# Patient Record
Sex: Female | Born: 1990 | Hispanic: Yes | Smoking: Never smoker
Health system: Southern US, Community
[De-identification: ages and names within clinical notes are randomized; demographics above are authoritative.]

## PROBLEM LIST (undated history)

## (undated) DIAGNOSIS — M5416 Radiculopathy, lumbar region: Secondary | ICD-10-CM

## (undated) DIAGNOSIS — G5603 Carpal tunnel syndrome, bilateral upper limbs: Secondary | ICD-10-CM

## (undated) DIAGNOSIS — M5126 Other intervertebral disc displacement, lumbar region: Secondary | ICD-10-CM

## (undated) DIAGNOSIS — M51369 Other intervertebral disc degeneration, lumbar region without mention of lumbar back pain or lower extremity pain: Secondary | ICD-10-CM

## (undated) DIAGNOSIS — Z789 Other specified health status: Secondary | ICD-10-CM

## (undated) DIAGNOSIS — M5431 Sciatica, right side: Secondary | ICD-10-CM

## (undated) DIAGNOSIS — M5430 Sciatica, unspecified side: Secondary | ICD-10-CM

## (undated) HISTORY — PX: CHOLECYSTECTOMY: SHX55

## (undated) HISTORY — DX: Other specified health status: Z78.9

## (undated) HISTORY — PX: TONSILLECTOMY: SUR1361

---

## 2013-06-26 ENCOUNTER — Emergency Department: Payer: Self-pay | Admitting: Emergency Medicine

## 2013-08-21 ENCOUNTER — Emergency Department: Payer: Self-pay | Admitting: Emergency Medicine

## 2013-08-21 LAB — COMPREHENSIVE METABOLIC PANEL
ALT: 22 U/L
ANION GAP: 7 (ref 7–16)
AST: 24 U/L (ref 15–37)
Albumin: 4 g/dL (ref 3.4–5.0)
Alkaline Phosphatase: 90 U/L
BUN: 14 mg/dL (ref 7–18)
Bilirubin,Total: 0.3 mg/dL (ref 0.2–1.0)
CHLORIDE: 102 mmol/L (ref 98–107)
CREATININE: 0.82 mg/dL (ref 0.60–1.30)
Calcium, Total: 8.8 mg/dL (ref 8.5–10.1)
Co2: 31 mmol/L (ref 21–32)
EGFR (African American): >60
EGFR (Non-African Amer.): >60
GLUCOSE: 106 mg/dL — AB (ref 65–99)
Osmolality: 280 (ref 275–301)
Potassium: 3.6 mmol/L (ref 3.5–5.1)
Sodium: 140 mmol/L (ref 136–145)
TOTAL PROTEIN: 8 g/dL (ref 6.4–8.2)

## 2013-08-21 LAB — URINALYSIS, COMPLETE
BILIRUBIN, UR: NEGATIVE
Bacteria: NONE SEEN
Blood: NEGATIVE
Glucose,UR: NEGATIVE mg/dL (ref 0–75)
KETONE: NEGATIVE
Leukocyte Esterase: NEGATIVE
Nitrite: NEGATIVE
Ph: 7 (ref 4.5–8.0)
Protein: NEGATIVE
RBC,UR: 1 /HPF (ref 0–5)
SPECIFIC GRAVITY: 1.018 (ref 1.003–1.030)

## 2013-08-21 LAB — CBC
HCT: 41.7 % (ref 35.0–47.0)
HGB: 14.1 g/dL (ref 12.0–16.0)
MCH: 30.6 pg (ref 26.0–34.0)
MCHC: 33.8 g/dL (ref 32.0–36.0)
MCV: 91 fL (ref 80–100)
PLATELETS: 225 10*3/uL (ref 150–440)
RBC: 4.6 10*6/uL (ref 3.80–5.20)
RDW: 12.5 % (ref 11.5–14.5)
WBC: 9.1 10*3/uL (ref 3.6–11.0)

## 2013-08-22 LAB — LIPASE, BLOOD: Lipase: 125 U/L (ref 73–393)

## 2013-11-10 LAB — HM PAP SMEAR

## 2016-06-27 ENCOUNTER — Emergency Department
Admission: EM | Admit: 2016-06-27 | Discharge: 2016-06-27 | Disposition: A | Discharge status: Home / Self Care | Payer: Managed Care, Other (non HMO) | Attending: Student in an Organized Health Care Education/Training Program | Admitting: Student in an Organized Health Care Education/Training Program

## 2016-06-27 ENCOUNTER — Encounter: Payer: Self-pay | Admitting: Emergency Medicine

## 2016-06-27 ENCOUNTER — Emergency Department: Payer: Managed Care, Other (non HMO)

## 2016-06-27 DIAGNOSIS — R109 Unspecified abdominal pain: Secondary | ICD-10-CM

## 2016-06-27 DIAGNOSIS — R197 Diarrhea, unspecified: Secondary | ICD-10-CM | POA: Insufficient documentation

## 2016-06-27 DIAGNOSIS — Z9049 Acquired absence of other specified parts of digestive tract: Secondary | ICD-10-CM | POA: Diagnosis not present

## 2016-06-27 DIAGNOSIS — R112 Nausea with vomiting, unspecified: Secondary | ICD-10-CM | POA: Insufficient documentation

## 2016-06-27 DIAGNOSIS — M791 Myalgia: Secondary | ICD-10-CM | POA: Diagnosis present

## 2016-06-27 LAB — CBC
HEMATOCRIT: 42.3 % (ref 35.0–47.0)
Hemoglobin: 14.9 g/dL (ref 12.0–16.0)
MCH: 31.8 pg (ref 26.0–34.0)
MCHC: 35.2 g/dL (ref 32.0–36.0)
MCV: 90.3 fL (ref 80.0–100.0)
Platelets: 186 10*3/uL (ref 150–440)
RBC: 4.69 MIL/uL (ref 3.80–5.20)
RDW: 12.5 % (ref 11.5–14.5)
WBC: 8.2 10*3/uL (ref 3.6–11.0)

## 2016-06-27 LAB — COMPREHENSIVE METABOLIC PANEL
ALK PHOS: 66 U/L (ref 38–126)
ALT: 52 U/L (ref 14–54)
AST: 93 U/L — ABNORMAL HIGH (ref 15–41)
Albumin: 4 g/dL (ref 3.5–5.0)
Anion gap: 8 (ref 5–15)
BILIRUBIN TOTAL: 0.7 mg/dL (ref 0.3–1.2)
BUN: 11 mg/dL (ref 6–20)
CO2: 26 mmol/L (ref 22–32)
Calcium: 9 mg/dL (ref 8.9–10.3)
Chloride: 102 mmol/L (ref 101–111)
Creatinine, Ser: 0.83 mg/dL (ref 0.44–1.00)
GFR calc Af Amer: >60 mL/min (ref >60)
GFR calc non Af Amer: >60 mL/min (ref >60)
GLUCOSE: 112 mg/dL — AB (ref 65–99)
POTASSIUM: 3.6 mmol/L (ref 3.5–5.1)
Sodium: 136 mmol/L (ref 135–145)
TOTAL PROTEIN: 7.8 g/dL (ref 6.5–8.1)

## 2016-06-27 LAB — URINALYSIS, COMPLETE (UACMP) WITH MICROSCOPIC
BACTERIA UA: NONE SEEN
Bilirubin Urine: NEGATIVE
Glucose, UA: NEGATIVE mg/dL
Ketones, ur: NEGATIVE mg/dL
NITRITE: NEGATIVE
PROTEIN: 30 mg/dL — AB
SPECIFIC GRAVITY, URINE: 1.018 (ref 1.005–1.030)
pH: 6 (ref 5.0–8.0)

## 2016-06-27 LAB — HCG, QUANTITATIVE, PREGNANCY

## 2016-06-27 LAB — PREGNANCY, URINE: PREG TEST UR: NEGATIVE

## 2016-06-27 LAB — LIPASE, BLOOD: LIPASE: 21 U/L (ref 11–51)

## 2016-06-27 MED ORDER — KETOROLAC TROMETHAMINE 30 MG/ML IJ SOLN
15.0000 mg | Freq: Once | INTRAMUSCULAR | Status: AC
  Administered 2016-06-27: 15 mg via INTRAVENOUS
  Filled 2016-06-27: qty 1

## 2016-06-27 MED ORDER — GI COCKTAIL ~~LOC~~
30.0000 mL | Freq: Once | ORAL | Status: AC
  Administered 2016-06-27: 30 mL via ORAL
  Filled 2016-06-27: qty 30

## 2016-06-27 MED ORDER — DICYCLOMINE HCL 10 MG PO CAPS: 10.0000 mg | ORAL_CAPSULE | Freq: Three times a day (TID) | ORAL | 0 refills | Status: DC | PRN

## 2016-06-27 MED ORDER — PROMETHAZINE HCL 25 MG/ML IJ SOLN
12.5000 mg | Freq: Once | INTRAMUSCULAR | Status: AC
  Administered 2016-06-27: 12.5 mg via INTRAVENOUS
  Filled 2016-06-27: qty 1

## 2016-06-27 MED ORDER — PROMETHAZINE HCL 12.5 MG PO TABS: 12.5000 mg | ORAL_TABLET | Freq: Four times a day (QID) | ORAL | 0 refills | Status: DC | PRN

## 2016-06-27 MED ORDER — SODIUM CHLORIDE 0.9 % IV BOLUS (SEPSIS)
1000.0000 mL | Freq: Once | INTRAVENOUS | Status: AC
  Administered 2016-06-27: 1000 mL via INTRAVENOUS

## 2016-06-27 NOTE — ED Provider Notes (Signed)
[facility] Emergency Department Provider Note    None    (approximate)  I have reviewed the triage vital signs and the nursing notes.   HISTORY  Chief Complaint Generalized Body Aches and Abdominal Cramping    HPI Teresa Strickland is a 26 y.o. female who presents with chief complaint of generalized body aches associated with nausea, decreased appetite and non-bloody non-melanotic diarrhea. Patient states that the abdominal pain comes in waves. She currently has none right now but has not been able to eat or drink anything because of the nausea. States that she did have a fever earlier in the day. No sick contacts. No shortness of breath. No right lower quadrant pain. No vaginal discharge. No dysuria or increased urinary frequency. No flank pain. She is status post cholecystectomy. Asked where her primary location of her pain and she points to the epigastric region.   History reviewed. No pertinent past medical history. History reviewed. No pertinent family history. Past Surgical History:  Procedure Laterality Date  . CHOLECYSTECTOMY     There are no active problems to display for this patient.     Prior to Admission medications   Medication Sig Start Date End Date Taking? Authorizing Provider  dicyclomine (BENTYL) 10 MG capsule Take 1 capsule (10 mg total) by mouth 3 (three) times daily as needed for spasms. 06/27/16 07/11/16  Willy Eddyobinson, Ava Deguire, MD  promethazine (PHENERGAN) 12.5 MG tablet Take 1 tablet (12.5 mg total) by mouth every 6 (six) hours as needed for nausea or vomiting. 06/27/16   Willy Eddyobinson, Nayda Riesen, MD    Allergies Patient has no known allergies.    Social History Social History  Substance Use Topics  . Smoking status: Never Smoker  . Smokeless tobacco: Never Used  . Alcohol use 3.0 oz/week    5 Cans of beer per week    Review of Systems Patient denies headaches, rhinorrhea, blurry vision, numbness, shortness of breath, chest pain,  edema, cough, abdominal pain, nausea, vomiting, diarrhea, dysuria, fevers, rashes or hallucinations unless otherwise stated above in HPI. ____________________________________________   PHYSICAL EXAM:  VITAL SIGNS: Vitals:   06/27/16 1900 06/27/16 2000  BP: 114/72 109/73  Pulse: 74 74  Resp:    Temp:      Constitutional: Alert and oriented. Well appearing and in no acute distress. Eyes: Conjunctivae are normal.  Head: Atraumatic. Nose: No congestion/rhinnorhea. Mouth/Throat: Mucous membranes are moist.   Neck: No stridor. Painless ROM.  Cardiovascular: Normal rate, regular rhythm. Grossly normal heart sounds.  Good peripheral circulation. Respiratory: Normal respiratory effort.  No retractions. Lungs CTAB. Gastrointestinal: Soft with mild epigastric ttp. No distention. No RLQ ttp No abdominal bruits. No CVA tenderness. Musculoskeletal: No lower extremity tenderness nor edema.  No joint effusions. Neurologic:  Normal speech and language. No gross focal neurologic deficits are appreciated. No facial droop Skin:  Skin is warm, dry and intact. No rash noted. Psychiatric: Mood and affect are normal. Speech and behavior are normal.  ____________________________________________   LABS (all labs ordered are listed, but only abnormal results are displayed)  Results for orders placed or performed during the hospital encounter of 06/27/16 (from the past 24 hour(s))  Lipase, blood     Status: None   Collection Time: 06/27/16  3:42 PM  Result Value Ref Range   Lipase 21 11 - 51 U/L  Comprehensive metabolic panel     Status: Abnormal   Collection Time: 06/27/16  3:42 PM  Result Value Ref Range   Sodium  136 135 - 145 mmol/L   Potassium 3.6 3.5 - 5.1 mmol/L   Chloride 102 101 - 111 mmol/L   CO2 26 22 - 32 mmol/L   Glucose, Bld 112 (H) 65 - 99 mg/dL   BUN 11 6 - 20 mg/dL   Creatinine, Ser 9.14 0.44 - 1.00 mg/dL   Calcium 9.0 8.9 - 78.2 mg/dL   Total Protein 7.8 6.5 - 8.1 g/dL    Albumin 4.0 3.5 - 5.0 g/dL   AST 93 (H) 15 - 41 U/L   ALT 52 14 - 54 U/L   Alkaline Phosphatase 66 38 - 126 U/L   Total Bilirubin 0.7 0.3 - 1.2 mg/dL   GFR calc non Af Amer >60 >60 mL/min   GFR calc Af Amer >60 >60 mL/min   Anion gap 8 5 - 15  CBC     Status: None   Collection Time: 06/27/16  3:42 PM  Result Value Ref Range   WBC 8.2 3.6 - 11.0 K/uL   RBC 4.69 3.80 - 5.20 MIL/uL   Hemoglobin 14.9 12.0 - 16.0 g/dL   HCT 95.6 21.3 - 08.6 %   MCV 90.3 80.0 - 100.0 fL   MCH 31.8 26.0 - 34.0 pg   MCHC 35.2 32.0 - 36.0 g/dL   RDW 57.8 46.9 - 62.9 %   Platelets 186 150 - 440 K/uL  Urinalysis, Complete w Microscopic     Status: Abnormal   Collection Time: 06/27/16  3:42 PM  Result Value Ref Range   Color, Urine YELLOW (A) YELLOW   APPearance HAZY (A) CLEAR   Specific Gravity, Urine 1.018 1.005 - 1.030   pH 6.0 5.0 - 8.0   Glucose, UA NEGATIVE NEGATIVE mg/dL   Hgb urine dipstick SMALL (A) NEGATIVE   Bilirubin Urine NEGATIVE NEGATIVE   Ketones, ur NEGATIVE NEGATIVE mg/dL   Protein, ur 30 (A) NEGATIVE mg/dL   Nitrite NEGATIVE NEGATIVE   Leukocytes, UA TRACE (A) NEGATIVE   RBC / HPF 0-5 0 - 5 RBC/hpf   WBC, UA 6-30 0 - 5 WBC/hpf   Bacteria, UA NONE SEEN NONE SEEN   Squamous Epithelial / LPF 0-5 (A) NONE SEEN   Mucous PRESENT   hCG, quantitative, pregnancy     Status: None   Collection Time: 06/27/16  3:42 PM  Result Value Ref Range   hCG, Beta Chain, Quant, S <1 <5 mIU/mL  Pregnancy, urine     Status: None   Collection Time: 06/27/16  3:42 PM  Result Value Ref Range   Preg Test, Ur NEGATIVE NEGATIVE   ____________________________________________  ____________________________________________  RADIOLOGY  I personally reviewed all radiographic images ordered to evaluate for the above acute complaints and reviewed radiology reports and findings.  These findings were personally discussed with the patient.  Please see medical record for radiology  report.  ____________________________________________   PROCEDURES  Procedure(s) performed:  Procedures    Critical Care performed: no ____________________________________________   INITIAL IMPRESSION / ASSESSMENT AND PLAN / ED COURSE  Pertinent labs & imaging results that were available during my care of the patient were reviewed by me and considered in my medical decision making (see chart for details).   DDX: colitis, gastritis, enteritis, viral illness  Teresa Strickland is a 26 y.o. who presents to the ED with symptoms as described above. Patient well-appearing and in no acute distress. Abdominal exam is soft and benign. Do not feel CT imaging clinically indicated. No evidence of pelvic infection based on history.  The patient will be placed on continuous pulse oximetry and telemetry for monitoring.  Laboratory evaluation will be sent to evaluate for the above complaints.     Clinical Course as of Jun 28 28  Fri Jun 27, 2016  1846 Patient reassessed and in acute distress.  Blood work is reassuring. Patient tolerating oral hydration. Patient states symptoms improved after medications. Due to patient was stable for discharge with close outpatient follow-up. I instructed the patient to return in 12-24 hours if she develops any sort of abdominal pain.  [PR]    Clinical Course User Index [PR] Willy Eddy, MD     ____________________________________________   FINAL CLINICAL IMPRESSION(S) / ED DIAGNOSES  Final diagnoses:  Abdominal cramping  Nausea vomiting and diarrhea      NEW MEDICATIONS STARTED DURING THIS VISIT:  There are no discharge medications for this patient.    Note:  This document was prepared using Dragon voice recognition software and may include unintentional dictation errors.    Willy Eddy, MD 06/28/16 [phone]

## 2016-06-27 NOTE — ED Notes (Signed)
Patient returned from US.

## 2016-06-27 NOTE — Discharge Instructions (Signed)

## 2016-06-27 NOTE — ED Notes (Signed)
Patient @ XR 

## 2016-06-27 NOTE — ED Notes (Signed)
Patient r/f XR 

## 2016-06-27 NOTE — ED Triage Notes (Signed)
Pt to ED from home c/o generalized body aches, fevers, lower mid abd cramping and diarrhea since Wednesday.

## 2017-08-12 ENCOUNTER — Ambulatory Visit
Admission: RE | Admit: 2017-08-12 | Discharge: 2017-08-12 | Disposition: A | Discharge status: Home / Self Care | Payer: Managed Care, Other (non HMO) | Source: Ambulatory Visit | Attending: Family Medicine | Admitting: Family Medicine

## 2017-08-12 ENCOUNTER — Other Ambulatory Visit: Payer: Self-pay | Admitting: Family Medicine

## 2017-08-12 DIAGNOSIS — M545 Low back pain: Secondary | ICD-10-CM | POA: Diagnosis not present

## 2018-10-29 ENCOUNTER — Other Ambulatory Visit: Payer: Self-pay

## 2018-10-29 DIAGNOSIS — Z20822 Contact with and (suspected) exposure to covid-19: Secondary | ICD-10-CM

## 2018-10-31 LAB — NOVEL CORONAVIRUS, NAA: SARS-CoV-2, NAA: NOT DETECTED

## 2019-01-25 ENCOUNTER — Encounter: Payer: Self-pay | Admitting: Obstetrics and Gynecology

## 2019-01-26 ENCOUNTER — Encounter: Payer: Self-pay | Admitting: Obstetrics and Gynecology

## 2019-01-26 ENCOUNTER — Ambulatory Visit (INDEPENDENT_AMBULATORY_CARE_PROVIDER_SITE_OTHER): Payer: Managed Care, Other (non HMO)

## 2019-01-26 ENCOUNTER — Other Ambulatory Visit: Payer: Managed Care, Other (non HMO)

## 2019-01-26 ENCOUNTER — Other Ambulatory Visit: Payer: Self-pay | Admitting: Obstetrics and Gynecology

## 2019-01-26 ENCOUNTER — Other Ambulatory Visit: Payer: Self-pay

## 2019-01-26 ENCOUNTER — Ambulatory Visit (INDEPENDENT_AMBULATORY_CARE_PROVIDER_SITE_OTHER): Payer: Managed Care, Other (non HMO) | Admitting: Obstetrics and Gynecology

## 2019-01-26 ENCOUNTER — Other Ambulatory Visit: Payer: Self-pay | Admitting: Obstetrics & Gynecology

## 2019-01-26 VITALS — BP 130/90 | Ht 60.0 in | Wt 165.0 lb

## 2019-01-26 DIAGNOSIS — Z30431 Encounter for routine checking of intrauterine contraceptive device: Secondary | ICD-10-CM

## 2019-01-26 DIAGNOSIS — Z124 Encounter for screening for malignant neoplasm of cervix: Secondary | ICD-10-CM

## 2019-01-26 DIAGNOSIS — Z113 Encounter for screening for infections with a predominantly sexual mode of transmission: Secondary | ICD-10-CM | POA: Diagnosis not present

## 2019-01-26 DIAGNOSIS — N8312 Corpus luteum cyst of left ovary: Secondary | ICD-10-CM | POA: Diagnosis not present

## 2019-01-26 DIAGNOSIS — N941 Unspecified dyspareunia: Secondary | ICD-10-CM

## 2019-01-26 NOTE — Patient Instructions (Signed)
I value your feedback and entrusting us with your care. If you get a [facility] patient survey, I would appreciate you taking the time to let us know about your experience today. Thank you!  As of December 16, 2018, your lab results will be released to your MyChart immediately, before I even have a chance to see them. Please give me time to review them and contact you if there are any abnormalities. Thank you for your patience.  

## 2019-01-26 NOTE — Progress Notes (Addendum)
Teresa Haggard, FNP   Chief Complaint  Patient presents with  . Pelvic Pain    during and after intercorse, no bleeding since IUD insertion  . [employer] Employee    HPI:      [name] is a 29 y.o. No obstetric history on file. who LMP was No LMP recorded. (Menstrual status: IUD)., presents today for NP> 3 yrs eval of dyspareunia for the past 2 yrs, but sx frequency and severity have increased past few months. Pt notes sharp, cramping pelvic pain with sex that then becomes epigastric/upper abd bloating and tenderness, and moves to lower abd/pelvic bloating and pain, lasting a few days. Has some gas with bloating. No n/v/d/constipation; has soft stool daily. Tries ibup/heating pad with minimal improvement; sx resolved on their own after a few days. Sx used to be occas but have been every time recently. No sx if not sex active. No vag sx, urin sx, GI sx. No rectal bleeding, blood in stool, GERD sx except with spicy food, no appetite change. S/p cholecystectomy in past.  Had neg UPT 2 wks ago.  Pt with Mirena, replaced 04/18/15. Pt is amenorrheic with IUD, with occas cramping, no BTB. Didn't have dyspareunia with previous IUD. Wasn't sex active first yr of current IUD.  No recent pap, last one 2015.   History reviewed. No pertinent past medical history.   Past Surgical History:  Procedure Laterality Date  . CHOLECYSTECTOMY      History reviewed. No pertinent family history.  Social History   Socioeconomic History  . Marital status: Divorced    Spouse name: Not on file  . Number of children: Not on file  . Years of education: Not on file  . Highest education level: Not on file  Occupational History  . Not on file  Tobacco Use  . Smoking status: Never Smoker  . Smokeless tobacco: Never Used  Substance and Sexual Activity  . Alcohol use: Yes    Alcohol/week: 5.0 standard drinks    Types: 5 Cans of beer per week  . Drug use: No  . Sexual activity: Yes    Birth  control/protection: I.U.D.    Comment: Mirena  Other Topics Concern  . Not on file  Social History Narrative  . Not on file   Social Determinants of Health   Financial Resource Strain:   . Difficulty of Paying Living Expenses: Not on file  Food Insecurity:   . Worried About Charity fundraiser in the Last Year: Not on file  . Ran Out of Food in the Last Year: Not on file  Transportation Needs:   . Lack of Transportation (Medical): Not on file  . Lack of Transportation (Non-Medical): Not on file  Physical Activity:   . Days of Exercise per Week: Not on file  . Minutes of Exercise per Session: Not on file  Stress:   . Feeling of Stress : Not on file  Social Connections:   . Frequency of Communication with Friends and Family: Not on file  . Frequency of Social Gatherings with Friends and Family: Not on file  . Attends Religious Services: Not on file  . Active Member of Clubs or Organizations: Not on file  . Attends Archivist Meetings: Not on file  . Marital Status: Not on file  Intimate Partner Violence:   . Fear of Current or Ex-Partner: Not on file  . Emotionally Abused: Not on file  . Physically Abused: Not on  file  . Sexually Abused: Not on file    Outpatient Medications Prior to Visit  Medication Sig Dispense Refill  . levonorgestrel (MIRENA) 20 MCG/24HR IUD by Intrauterine route.    . dicyclomine (BENTYL) 10 MG capsule Take 1 capsule (10 mg total) by mouth 3 (three) times daily as needed for spasms. 16 capsule 0  . promethazine (PHENERGAN) 12.5 MG tablet Take 1 tablet (12.5 mg total) by mouth every 6 (six) hours as needed for nausea or vomiting. 12 tablet 0   No facility-administered medications prior to visit.      ROS:  Review of Systems  Constitutional: Negative for fatigue, fever and unexpected weight change.  Respiratory: Negative for cough, shortness of breath and wheezing.   Cardiovascular: Negative for chest pain, palpitations and leg  swelling.  Gastrointestinal: Positive for abdominal distention and abdominal pain. Negative for blood in stool, constipation, diarrhea, nausea and vomiting.  Endocrine: Negative for cold intolerance, heat intolerance and polyuria.  Genitourinary: Positive for dyspareunia. Negative for dysuria, flank pain, frequency, genital sores, hematuria, menstrual problem, pelvic pain, urgency, vaginal bleeding, vaginal discharge and vaginal pain.  Musculoskeletal: Negative for back pain, joint swelling and myalgias.  Skin: Negative for rash.  Neurological: Negative for dizziness, syncope, light-headedness, numbness and headaches.  Hematological: Negative for adenopathy.  Psychiatric/Behavioral: Negative for agitation, confusion, sleep disturbance and suicidal ideas. The patient is not nervous/anxious.   BREAST: No symptoms   OBJECTIVE:   Vitals:  BP 130/90   Ht 5' (1.524 m)   Wt 165 lb (74.8 kg)   BMI 32.22 kg/m   Physical Exam Vitals reviewed.  Constitutional:      Appearance: She is well-developed.  Pulmonary:     Effort: Pulmonary effort is normal.  Abdominal:     Palpations: Abdomen is soft.     Tenderness: There is no abdominal tenderness. There is no guarding or rebound.  Genitourinary:    General: Normal vulva.     Pubic Area: No rash.      Labia:        Right: No rash, tenderness or lesion.        Left: No rash, tenderness or lesion.      Vagina: Normal. No vaginal discharge, erythema or tenderness.     Cervix: Normal.     Uterus: Normal. Not enlarged and not tender.      Adnexa: Right adnexa normal and left adnexa normal.       Right: No mass or tenderness.         Left: No mass or tenderness.       Comments: IUD STRINGS IN CX OS Musculoskeletal:        General: Normal range of motion.     Cervical back: Normal range of motion.  Skin:    General: Skin is warm and dry.  Neurological:     General: No focal deficit present.     Mental Status: She is alert and oriented to  person, place, and time.  Psychiatric:        Mood and Affect: Mood normal.        Behavior: Behavior normal.        Thought Content: Thought content normal.        Judgment: Judgment normal.    Results:  ULTRASOUND REPORT  Location: [facility]  Date of Service: 01/26/2019    Indications:Pelvic Pain Findings:  The uterus is anteverted and measures 8.0 x 4.9 x 3.5 cm. Echo texture is homogenous without evidence  of focal masses. The Endometrium measures 4.3 mm. The IUD is correctly placed within the uterus.   Right Ovary measures 3.0 x 2.8 x 2.7 cm. It is normal in appearance. Left Ovary measures 2.6 x 3.6 x 2.6 cm. It is normal in appearance. There is a corpus luteum in the left ovary. Survey of the adnexa demonstrates no adnexal masses. There is complex fluid in the pelvis measuring 65 x 30 x 65 mm.   Impression: 1. Normal appearing uterus, cervix and ovaries.  2. There is a corpus luteum in the left ovary. 3. The IUD is correctly placed within the uterus.  4. There is complex fluid seen in the pelvic.   Recommendations: 1.Clinical correlation with the patient's History and Physical Exam.  Deanna Artis, RT  Assessment/Plan: Dyspareunia in female - Plan: US PELVIS TRANSVAGINAL NON-OB (TV ONLY), Beta hCG quant (ref lab); Neg exam, complex fluid in pelvis. Rule out ruptured ectopic with serum beta HcG, but unlikely since pt had neg UPT 2 wks ago and sx have been going on for 2 yrs. If neg, will refer to GI for further eval given GI component. May need CT.   Encounter for routine checking of intrauterine contraceptive device (IUD) - Plan: US PELVIS TRANSVAGINAL NON-OB (TV ONLY); IUD in correct location.  Cervical cancer screening - Plan: IGP,CtNgTv,rfx Aptima HPV ASCU  Screening for STD (sexually transmitted disease) - Plan: IGP,CtNgTv,rfx Aptima HPV ASCU     Return if symptoms worsen or fail to improve.  Aarika Moon B. Taneasha Fuqua, PA-C 01/26/2019 5:22  PM

## 2019-01-27 ENCOUNTER — Telehealth: Payer: Self-pay | Admitting: Obstetrics and Gynecology

## 2019-01-27 DIAGNOSIS — R1030 Lower abdominal pain, unspecified: Secondary | ICD-10-CM

## 2019-01-27 DIAGNOSIS — R188 Other ascites: Secondary | ICD-10-CM

## 2019-01-27 LAB — BETA HCG QUANT (REF LAB): hCG Quant: <1 m[IU]/mL

## 2019-01-27 NOTE — Telephone Encounter (Signed)
Contacted Dr. Servando Snare about sx and u/s findings. He will see her and eval further. Ref sent.

## 2019-01-27 NOTE — Addendum Note (Signed)
Addended by: Althea Grimmer B on: 01/27/2019 01:32 PM   Modules accepted: Orders

## 2019-01-27 NOTE — Telephone Encounter (Signed)
Spoke with pt re: neg serum Beta HcG. Will contact GI about further eval.   Pt has also decided she would like IUD removed and change to nexplanon. Will sched this appt.

## 2019-01-27 NOTE — Telephone Encounter (Signed)
nexplanon insert with [facility] on 1/27 at 950

## 2019-01-28 NOTE — Telephone Encounter (Signed)
Noted. Nexplanon reserved for this patient. 

## 2019-01-30 LAB — IGP,CTNGTV,RFX APTIMA HPV ASCU
Chlamydia, Nuc. Acid Amp: NEGATIVE
Gonococcus, Nuc. Acid Amp: NEGATIVE
Trich vag by NAA: NEGATIVE

## 2019-02-01 NOTE — Progress Notes (Signed)
   Chief Complaint  Patient presents with  . Contraception    IUD removal/Nexplanon insertion     History of Present Illness:  Teresa Strickland is a 29 y.o. that had a Mirena IUD placed 04/18/15. Since that time, she has been amenorrheic but is having dyspareunia and pelvic pain. Had u/s last wk with complex fluid in pelvis. Seeing GI in 2 wks due to sx/findings. Pt wants to change to nexplanon to see if IUD is cause of dyspareunia/pain.   BP 120/80   Ht 5' (1.524 m)   Wt 162 lb (73.5 kg)   BMI 31.64 kg/m   Pelvic exam:  Two IUD strings present seen coming from the cervical os. EGBUS, vaginal vault and cervix: within normal limits  IUD Removal Strings of IUD identified and grasped.  IUD removed without problem with ring forceps.  Pt tolerated this well.  IUD noted to be intact.  Nexplanon Insertion  Patient given informed consent, signed copy in the chart, time out was performed.  Appropriate time out taken.  Patient's LEFT arm was prepped and draped in the usual sterile fashion. The ruler used to measure and mark insertion area.  Pt was prepped with betadine swab and then injected with 1.0 cc of 2% lidocaine with epinephrine. Nexplanon removed form packaging,  Device confirmed in needle, then inserted full length of needle and withdrawn per handbook instructions.  Pt insertion site covered with steri-strip and a bandage.   Minimal blood loss.  Pt tolerated the procedure welL.  Assessment: Encounter for IUD removal  Nexplanon insertion - Plan: etonogestrel (NEXPLANON) implant 68 mg   Meds ordered this encounter  Medications  . etonogestrel (NEXPLANON) implant 68 mg     Plan:   She was told to remove the dressing in 12-24 hours, to keep the incision area dry for 24 hours and to remove the Steristrip in 2-3  days.  Notify us if any signs of tenderness, redness, pain, or fevers develop. Condoms for 7 days.   Lexa Coronado B. Tuana Hoheisel, PA-C 02/02/2019 10:29 AM

## 2019-02-01 NOTE — Patient Instructions (Addendum)
I value your feedback and entrusting us with your care. If you get a [facility] patient survey, I would appreciate you taking the time to let us know about your experience today. Thank you!  As of December 16, 2018, your lab results will be released to your MyChart immediately, before I even have a chance to see them. Please give me time to review them and contact you if there are any abnormalities. Thank you for your patience.   Remove the dressing in 24 hours,  keep the incision area dry for 24 hours and remove the Steristrip in 2-3  days.  Notify us if any signs of tenderness, redness, pain, or fevers develop.  

## 2019-02-02 ENCOUNTER — Ambulatory Visit (INDEPENDENT_AMBULATORY_CARE_PROVIDER_SITE_OTHER): Payer: Managed Care, Other (non HMO) | Admitting: Obstetrics and Gynecology

## 2019-02-02 ENCOUNTER — Other Ambulatory Visit: Payer: Self-pay

## 2019-02-02 ENCOUNTER — Encounter: Payer: Self-pay | Admitting: Obstetrics and Gynecology

## 2019-02-02 VITALS — BP 120/80 | Ht 60.0 in | Wt 162.0 lb

## 2019-02-02 DIAGNOSIS — Z30432 Encounter for removal of intrauterine contraceptive device: Secondary | ICD-10-CM

## 2019-02-02 DIAGNOSIS — Z30017 Encounter for initial prescription of implantable subdermal contraceptive: Secondary | ICD-10-CM

## 2019-02-02 MED ORDER — ETONOGESTREL 68 MG ~~LOC~~ IMPL: 68.0000 mg | DRUG_IMPLANT | Freq: Once | SUBCUTANEOUS | Status: AC

## 2019-02-17 NOTE — Progress Notes (Signed)
Gastroenterology Consultation  Referring Provider:     Chad Cordial, PA-C Primary Care Physician:  Remi Haggard, FNP Primary Gastroenterologist:  Dr. Allen Norris     Reason for Consultation:     Abnormal ultrasound for pelvic pain        HPI:   Teresa Strickland is a 29 y.o. y/o female referred for consultation & management of abnormal ultrasound for pelvic pain by Dr. Lavena Bullion, Jordan Likes, FNP.  This patient had seen her OB/GYN and reported pelvic pain.  The patient underwent a transvaginal ultrasound that showed:  Impression: 1. Normal appearing uterus, cervix and ovaries.  2. There is a corpus luteum in the left ovary. 3. The IUD is correctly placed within the uterus.  4. There is complex fluid seen in the pelvic.   Recommendations: 1.Clinical correlation with the patient's History and Physical Exam. 2. Consider beta hCG and assessment for ectopic pregnancy, although low likelihood 3. Consider CT, other imaging, or referral to GI  Subsequent to that the patient had her IUD removed.  Due to the patient's reported fluid collection she is now being referred to GI.  The patient's beta hCG was negative and the patient does have a history of abnormal liver enzymes back in 2018 with a isolated AST of 93.  In 2015 her liver enzymes were normal. She reports that her pain usually starts when she has intercourse and then will last 5 days with the pain waning over the 5-day period.  When she has the pain she reports that she is bloated and generally does not feel well.  She had the IUD removed thinking that that may be causing her discomfort but has not engaged in intercourse since having it removed so she has not had the pain.  History reviewed. No pertinent past medical history.  Past Surgical History:  Procedure Laterality Date  . CHOLECYSTECTOMY      Prior to Admission medications   Not on File    History reviewed. No pertinent family history.   Social History   Tobacco Use  .  Smoking status: Never Smoker  . Smokeless tobacco: Never Used  Substance Use Topics  . Alcohol use: Yes    Alcohol/week: 5.0 standard drinks    Types: 5 Cans of beer per week  . Drug use: No    Allergies as of 02/18/2019 - Review Complete 02/18/2019  Allergen Reaction Noted  . Piperacillin-tazobactam in dex Rash 08/31/2013    Review of Systems:    All systems reviewed and negative except where noted in HPI.   Physical Exam:  BP (!) 141/87   Pulse 74   Temp 98.5 F (36.9 C) (Oral)   Ht 5' (1.524 m)   Wt 165 lb 12.8 oz (75.2 kg)   BMI 32.38 kg/m  No LMP recorded. (Menstrual status: IUD). General:   Alert,  Well-developed, well-nourished, pleasant and cooperative in NAD Head:  Normocephalic and atraumatic. Eyes:  Sclera clear, no icterus.   Conjunctiva pink. Ears:  Normal auditory acuity. Neck:  Supple; no masses or thyromegaly. Lungs:  Respirations even and unlabored.  Clear throughout to auscultation.   No wheezes, crackles, or rhonchi. No acute distress. Heart:  Regular rate and rhythm; no murmurs, clicks, rubs, or gallops. Abdomen:  Normal bowel sounds.  No bruits.  Soft, non-tender and non-distended without masses, hepatosplenomegaly or hernias noted.  No guarding or rebound tenderness.  Negative Carnett sign.   Rectal:  Deferred.  Pulses:  Normal pulses noted. Extremities:  No clubbing or edema.  No cyanosis. Neurologic:  Alert and oriented x3;  grossly normal neurologically. Skin:  Intact without significant lesions or rashes.  No jaundice. Lymph Nodes:  No significant cervical adenopathy. Psych:  Alert and cooperative. Normal mood and affect.  Imaging Studies: US PELVIS TRANSVAGINAL NON-OB (TV ONLY)  Result Date: 01/26/2019 Patient Name: Teresa Strickland DOB: 24-Nov-1990 MRN: [id number] ULTRASOUND REPORT Location: [facility] Date of Service: 01/26/2019 Indications:Pelvic Pain Findings: The uterus is anteverted and measures 8.0 x 4.9 x 3.5 cm. Echo texture is homogenous  without evidence of focal masses. The Endometrium measures 4.3 mm. The IUD is correctly placed within the uterus. Right Ovary measures 3.0 x 2.8 x 2.7 cm. It is normal in appearance. Left Ovary measures 2.6 x 3.6 x 2.6 cm. It is normal in appearance. There is a corpus luteum in the left ovary. Survey of the adnexa demonstrates no adnexal masses. There is complex fluid in the pelvis measuring 65 x 30 x 65 mm. Impression: 1. Normal appearing uterus, cervix and ovaries. 2. There is a corpus luteum in the left ovary. 3. The IUD is correctly placed within the uterus. 4. There is complex fluid seen in the pelvic. Recommendations: 1.Clinical correlation with the patient's History and Physical Exam. 2. Consider beta hCG and assessment for ectopic pregnancy, although low likelihood 3. Consider CT, other imaging, or referral to GI Deanna Artis, RT Review of ULTRASOUND.    I have personally reviewed images and report of recent ultrasound done at [facility].    Plan of management to be discussed with pa Copland Annamarie Major, MD, [facility], [facility] 01/26/2019  3:07 PM   Assessment and Plan:   Teresa Strickland is a 29 y.o. y/o female who comes in with a history of abdominal pain that occurs during the onset of intercourse and last 5 days.  The patient had a transvaginal ultrasound that showed her to have a complex cyst in her pelvis.  It was recommended that the patient see GI.  The patient will be set up for a CT scan of the abdomen and pelvis to look for a cause of the complex fluid collection.  The patient has yet to have intercourse after having the IUD removed to see if that was the cause of her pain.  The patient will be contacted with the results of the CT scan and any further recommendations once that has been done.  The patient has been explained the plan and agrees with it.    Midge Minium, MD. Clementeen Graham    Note: This dictation was prepared with Dragon dictation along with smaller phrase  technology. Any transcriptional errors that result from this process are unintentional.

## 2019-02-18 ENCOUNTER — Ambulatory Visit: Payer: Managed Care, Other (non HMO) | Admitting: Gastroenterology

## 2019-02-18 ENCOUNTER — Encounter: Payer: Self-pay | Admitting: Gastroenterology

## 2019-02-18 ENCOUNTER — Other Ambulatory Visit: Payer: Self-pay

## 2019-02-18 DIAGNOSIS — R933 Abnormal findings on diagnostic imaging of other parts of digestive tract: Secondary | ICD-10-CM | POA: Diagnosis not present

## 2019-03-02 ENCOUNTER — Other Ambulatory Visit: Payer: Self-pay

## 2019-03-02 DIAGNOSIS — R933 Abnormal findings on diagnostic imaging of other parts of digestive tract: Secondary | ICD-10-CM

## 2019-03-07 ENCOUNTER — Other Ambulatory Visit: Payer: Self-pay

## 2019-03-07 ENCOUNTER — Ambulatory Visit
Admission: RE | Admit: 2019-03-07 | Discharge: 2019-03-07 | Disposition: A | Discharge status: Home / Self Care | Payer: Managed Care, Other (non HMO) | Source: Ambulatory Visit | Attending: Gastroenterology | Admitting: Gastroenterology

## 2019-03-07 ENCOUNTER — Ambulatory Visit: Payer: Managed Care, Other (non HMO)

## 2019-03-07 DIAGNOSIS — R933 Abnormal findings on diagnostic imaging of other parts of digestive tract: Secondary | ICD-10-CM | POA: Diagnosis not present

## 2019-03-07 MED ORDER — IOHEXOL 300 MG/ML  SOLN
100.0000 mL | Freq: Once | INTRAMUSCULAR | Status: AC | PRN
  Administered 2019-03-07: 100 mL via INTRAVENOUS

## 2019-03-09 ENCOUNTER — Telehealth: Payer: Self-pay

## 2019-03-09 NOTE — Telephone Encounter (Signed)
Pt notified of US results via mychart.  

## 2019-03-09 NOTE — Telephone Encounter (Signed)
-----   Message from Midge Minium, MD sent at 03/09/2019  5:48 AM EST ----- Let the patient know that what was found on the ultrasound was not seen on the CT scan of the pelvis meaning that whenever that fluid collection was has since resolved.

## 2019-03-21 NOTE — Telephone Encounter (Signed)
Nexplanon rcvd/charged 02/02/2019

## 2022-01-21 ENCOUNTER — Encounter: Payer: Self-pay | Admitting: Obstetrics and Gynecology

## 2022-01-21 ENCOUNTER — Ambulatory Visit: Payer: Managed Care, Other (non HMO) | Admitting: Obstetrics and Gynecology

## 2022-01-21 VITALS — BP 112/80 | Ht 60.0 in | Wt 165.0 lb

## 2022-01-21 DIAGNOSIS — Z3046 Encounter for surveillance of implantable subdermal contraceptive: Secondary | ICD-10-CM | POA: Diagnosis not present

## 2022-01-21 MED ORDER — ETONOGESTREL 68 MG ~~LOC~~ IMPL
68.0000 mg | DRUG_IMPLANT | Freq: Once | SUBCUTANEOUS | Status: AC
  Administered 2022-01-21: 68 mg via SUBCUTANEOUS

## 2022-01-21 NOTE — Progress Notes (Signed)
Patient, No Pcp Per   Chief Complaint  Patient presents with   Contraception    Nexplanon replacement    HPI:      [name] is a 32 y.o. G0P0000 whose LMP was No LMP recorded. Patient has had an implant., presents today for nexplanon replacement. Nexplaon placed 02/02/19; no vaginal bleeding/dysmen. She is sexually active, no pain/bleeding.  Neg pap 01/26/19, no HPV DNA done then due to age  Past Medical History:  Diagnosis Date   No pertinent past medical history     Past Surgical History:  Procedure Laterality Date   CHOLECYSTECTOMY      History reviewed. No pertinent family history.  Social History   Socioeconomic History   Marital status: Divorced    Spouse name: Not on file   Number of children: Not on file   Years of education: Not on file   Highest education level: Not on file  Occupational History   Not on file  Tobacco Use   Smoking status: Never   Smokeless tobacco: Never  Vaping Use   Vaping Use: Never used  Substance and Sexual Activity   Alcohol use: Yes    Alcohol/week: 5.0 standard drinks of alcohol    Types: 5 Cans of beer per week   Drug use: No   Sexual activity: Yes    Birth control/protection: Implant  Other Topics Concern   Not on file  Social History Narrative   Not on file   Social Determinants of Health   Financial Resource Strain: Not on file  Food Insecurity: Not on file  Transportation Needs: Not on file  Physical Activity: Not on file  Stress: Not on file  Social Connections: Not on file  Intimate Partner Violence: Not on file    Outpatient Medications Prior to Visit  Medication Sig Dispense Refill   etonogestrel (NEXPLANON) 68 MG IMPL implant 1 each by Subdermal route once.     Facility-Administered Medications Prior to Visit  Medication Dose Route Frequency Provider Last Rate Last Admin   etonogestrel (NEXPLANON) implant 68 mg  68 mg Subdermal Once Violetta Lavalle B, PA-C          ROS:  Review of  Systems  Constitutional:  Negative for fever.  Gastrointestinal:  Negative for blood in stool, constipation, diarrhea, nausea and vomiting.  Genitourinary:  Negative for dyspareunia, dysuria, flank pain, frequency, hematuria, urgency, vaginal bleeding, vaginal discharge and vaginal pain.  Musculoskeletal:  Negative for back pain.  Skin:  Negative for rash.   BREAST: No symptoms   OBJECTIVE:   Vitals:  BP 112/80   Ht 5' (1.524 m)   Wt 165 lb (74.8 kg)   BMI 32.22 kg/m    Nexplanon removal Procedure note - The Nexplanon was noted in the patient's arm and the end was identified. The skin was cleansed with a Betadine solution. A small injection of subcutaneous lidocaine with epinephrine was given over the end of the implant. An incision was made at the end of the implant. The rod was noted in the incision and grasped with a hemostat. It was noted to be intact.  Steri-Strip was placed approximating the incision. Hemostasis was noted.  Nexplanon Insertion  Patient given informed consent, signed copy in the chart, time out was performed.  Appropriate time out taken.  Patient's LEFT arm was prepped and draped in the usual sterile fashion. The ruler used to measure and mark insertion area.  Pt was prepped with betadine swab and  then injected with 1.0 cc of 2% lidocaine with epinephrine. Nexplanon removed form packaging,  Device confirmed in needle, then inserted full length of needle and withdrawn per handbook instructions.  Pt insertion site covered with steri-strip and a bandage.   Minimal blood loss.  Pt tolerated the procedure well.   Assessment/Plan: Encounter for removal and reinsertion of Nexplanon - Plan: etonogestrel (NEXPLANON) implant 68 mg  She was told to remove the dressing in 12-24 hours, to keep the incision area dry for 24 hours and to remove the Steristrip in 2-3  days.  Notify us if any signs of tenderness, redness, pain, or fevers develop.  Meds ordered this encounter   Medications   etonogestrel (NEXPLANON) implant 68 mg      Return in about 4 weeks (around 0/17/4944) for annual.  Rella Egelston B. Myrtle Haller, PA-C 01/21/2022 4:46 PM

## 2022-01-21 NOTE — Patient Instructions (Signed)
I value your feedback and you entrusting us with your care. If you get a [facility] patient survey, I would appreciate you taking the time to let us know about your experience today. Thank you!  Remove the dressing in 24 hours,  keep the incision area dry for 24 hours and remove the Steristrip in 2-3  days.  Notify us if any signs of tenderness, redness, pain, or fevers develop.   

## 2022-01-22 ENCOUNTER — Encounter: Payer: Self-pay | Admitting: Obstetrics and Gynecology

## 2022-02-17 IMAGING — CT CT PELVIS W/ CM
2 of 3 series · 16 of 46 positions shown, 18 images · IV contrast (omnipaque)
Comparison: None.

CLINICAL DATA: Pelvic mass on exam. Approximately 6 weeks status
post IUD removal.

EXAM:
CT PELVIS WITH CONTRAST
TECHNIQUE: Multidetector CT imaging of the pelvis was performed using the
standard protocol following the bolus administration of intravenous
contrast.
CONTRAST:  100mL OMNIPAQUE IOHEXOL 300 MG/ML  SOLN

[Series 2: routine abd/pel with · axial · 0.80mm/px · z∈[-816,-581]mm · 13 of 55 slices shown, 15 images]
[im 4/55  soft-tissue]
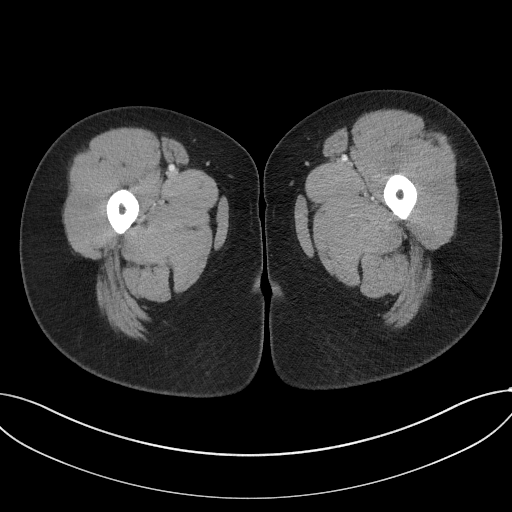
[im 4/55  bone]
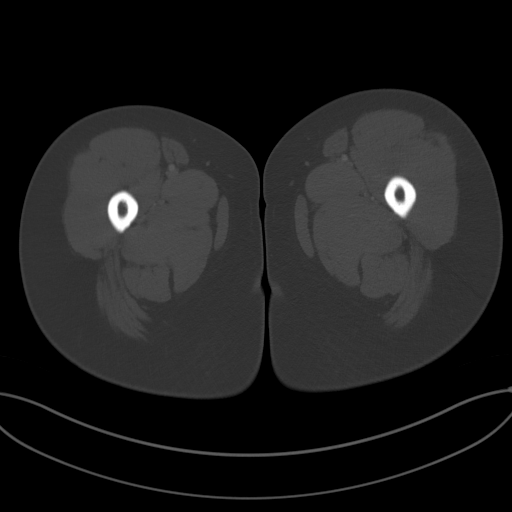
[im 7/55  soft-tissue]
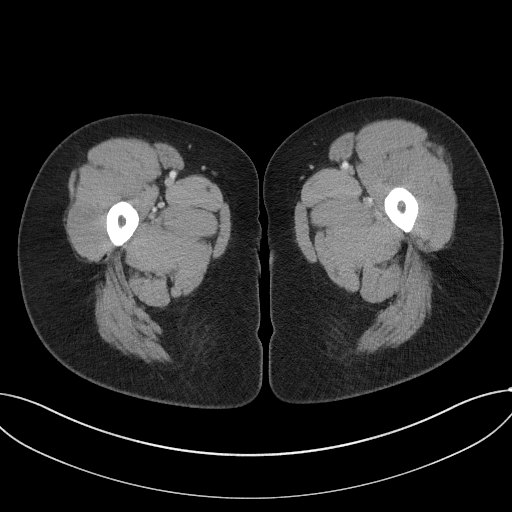
[im 11/55  soft-tissue]
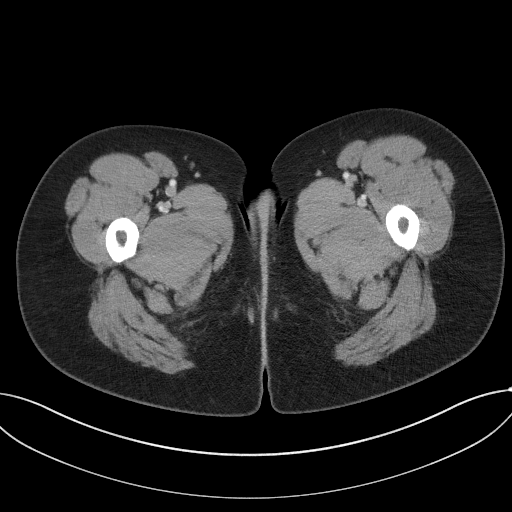
[im 16/55  soft-tissue]
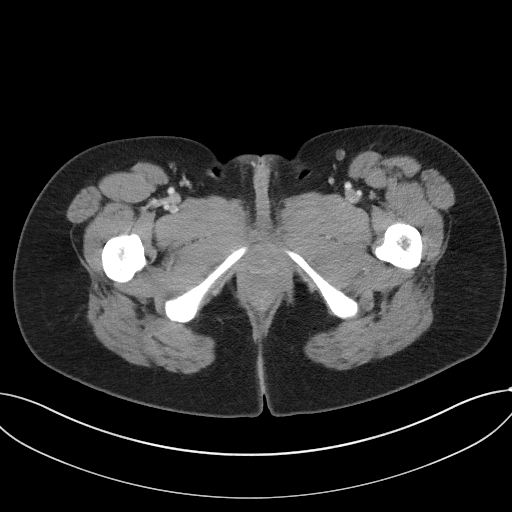
[im 20/55  soft-tissue]
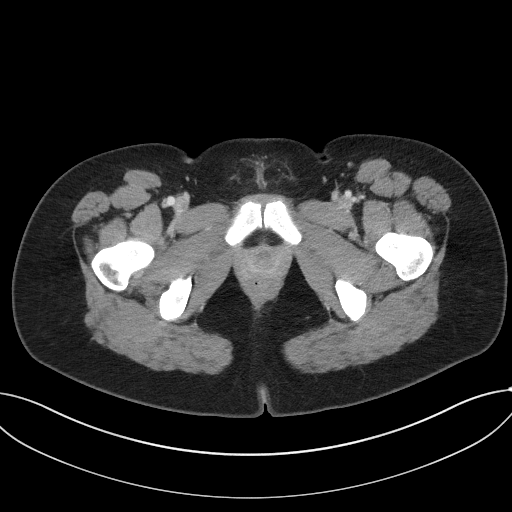
[im 23/55  soft-tissue]
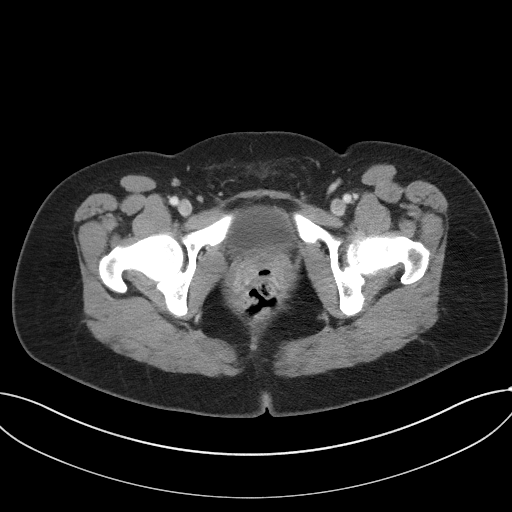
[im 28/55  soft-tissue]
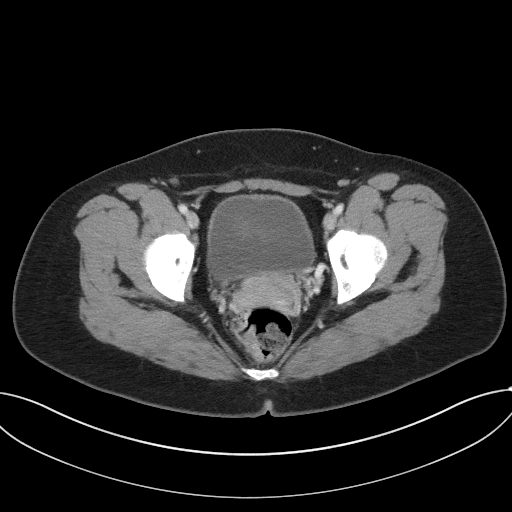
[im 32/55  soft-tissue]
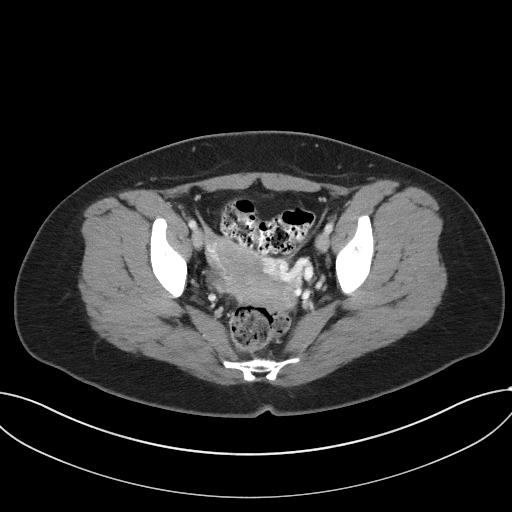
[im 35/55  soft-tissue]
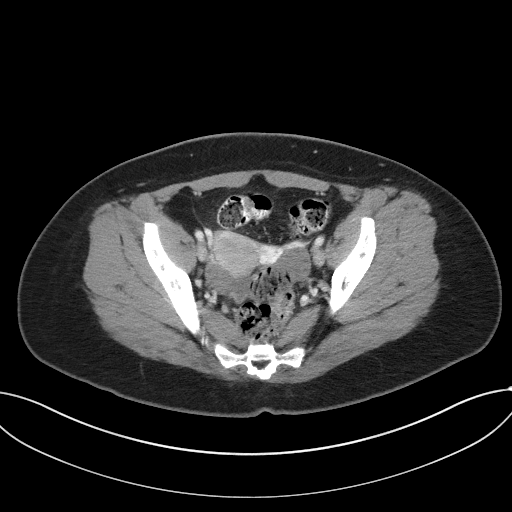
[im 35/55  bone]
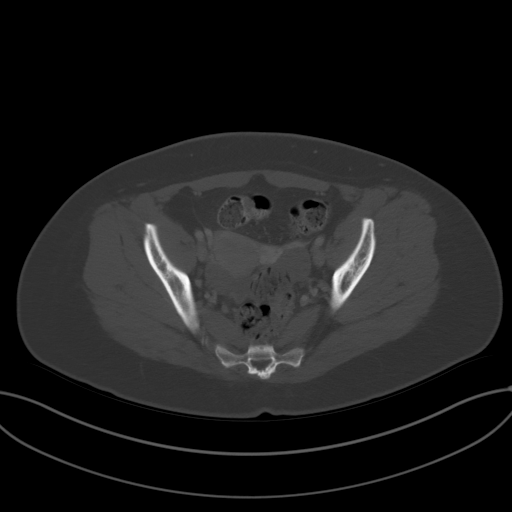
[im 39/55  soft-tissue]
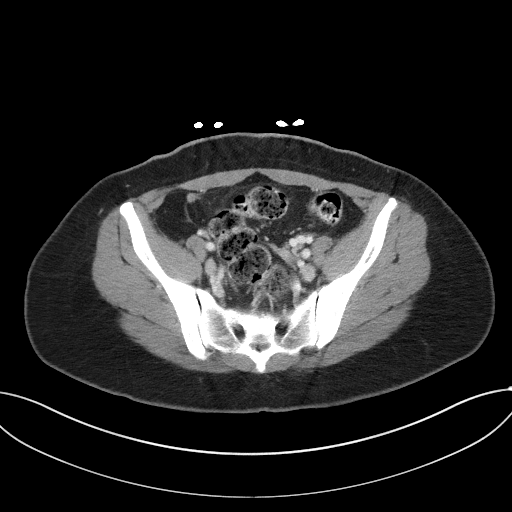
[im 44/55  soft-tissue]
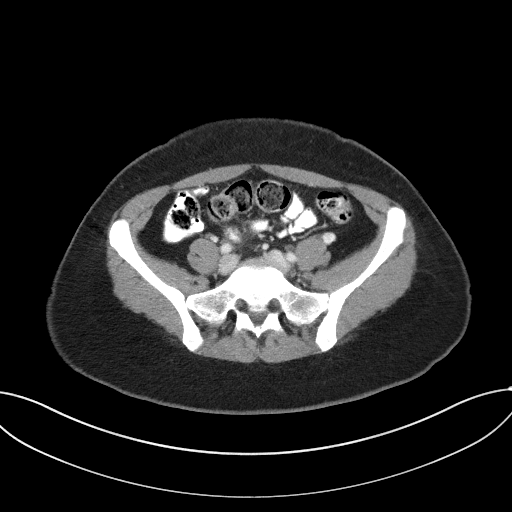
[im 48/55  soft-tissue]
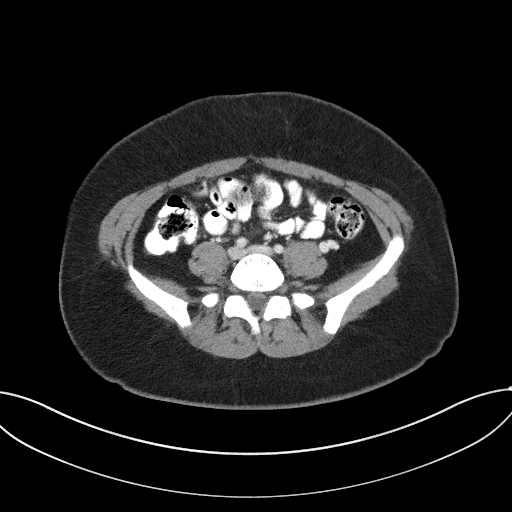
[im 51/55  soft-tissue]
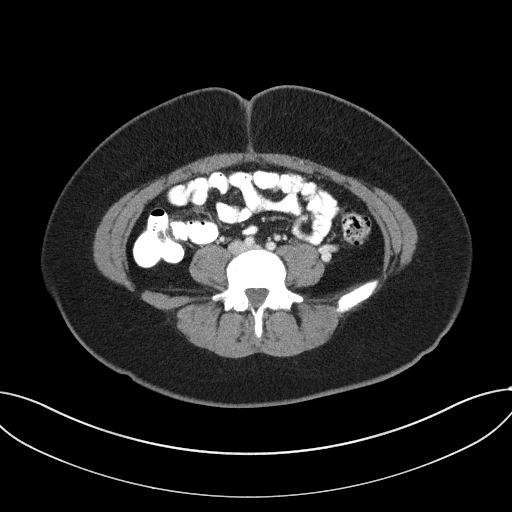

[Series 4: coronal st · coronal · 0.53mm/px · 3 of 85 slices shown]
[im 29/85  soft-tissue]
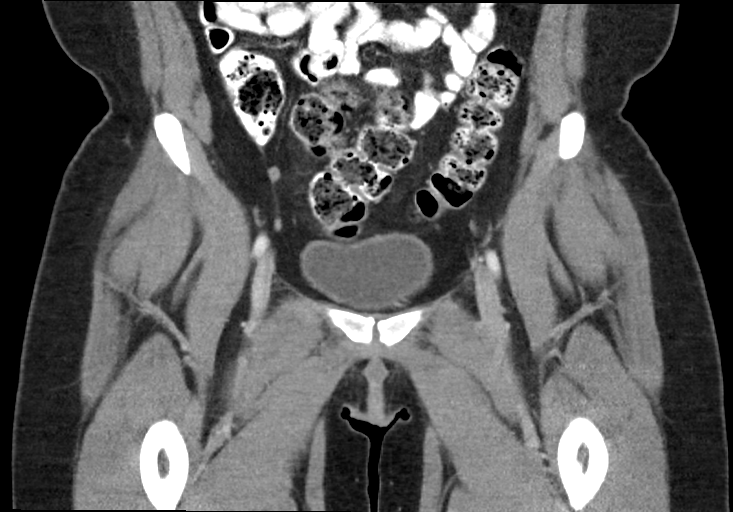
[im 38/85  soft-tissue]
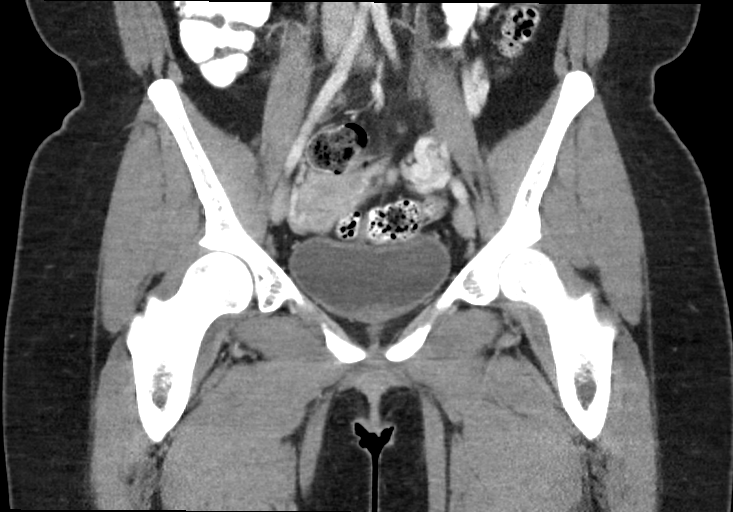
[im 47/85  soft-tissue]
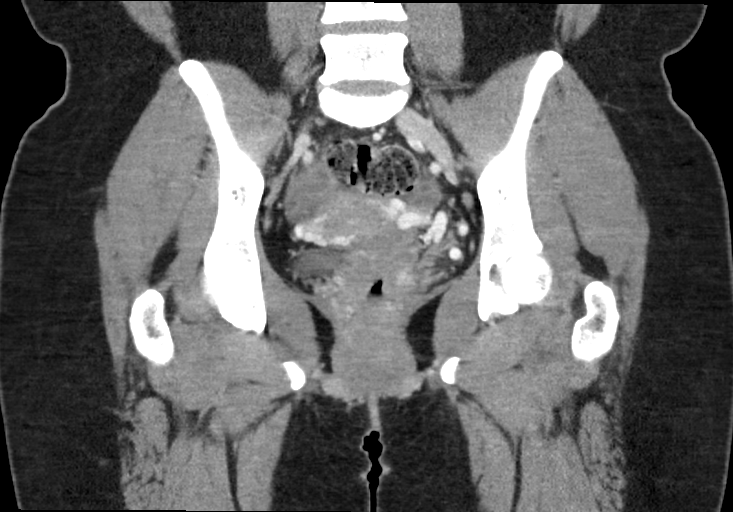

[16 of 46 positions shown; findings below may reference images not displayed]

FINDINGS: Lower Urinary Tract: Unremarkable urinary bladder.

Bowel: Unremarkable pelvic bowel loops.

Vascular/Lymphatic: No pathologically enlarged lymph nodes or other
significant abnormality.

Reproductive: Uterus and ovaries are unremarkable in appearance. No
mass, inflammatory process, or abnormal fluid collections.

Other: None.

Musculoskeletal: No significant abnormality identified.
IMPRESSION: Negative. No acute findings or other significant abnormality.

## 2022-02-26 NOTE — Progress Notes (Unsigned)
   PCP:  Patient, No Pcp Per   No chief complaint on file.    HPI:      Teresa Strickland is a 32 y.o. G0P0000 whose LMP was No LMP recorded. Patient has had an implant., presents today for her annual examination.  Her menses are {norm/abn:715}, lasting {number: 22536} days.  Dysmenorrhea {dysmen:716}. She {does:18564} have intermenstrual bleeding.  Sex activity: {sex active: 315163}. Nexplanon replaced 01/21/22 Last Pap: 01/26/19  Results were: no abnormalities /neg HPV DNA  Hx of STDs: {STD hx:14358}  Last mammogram: {date:304500300}  Results were: {norm/abn:13465} There is no FH of breast cancer. There is no FH of ovarian cancer. The patient {does:18564} do self-breast exams.  Tobacco use: {tob:20664} Alcohol use: {Alcohol:11675} No drug use.  Exercise: {exercise:31265}  She {does:18564} get adequate calcium and Vitamin D in her diet.  There are no problems to display for this patient.   Past Surgical History:  Procedure Laterality Date   CHOLECYSTECTOMY      No family history on file.  Social History   Socioeconomic History   Marital status: Divorced    Spouse name: Not on file   Number of children: Not on file   Years of education: Not on file   Highest education level: Not on file  Occupational History   Not on file  Tobacco Use   Smoking status: Never   Smokeless tobacco: Never  Vaping Use   Vaping Use: Never used  Substance and Sexual Activity   Alcohol use: Yes    Alcohol/week: 5.0 standard drinks of alcohol    Types: 5 Cans of beer per week   Drug use: No   Sexual activity: Yes    Birth control/protection: Implant  Other Topics Concern   Not on file  Social History Narrative   Not on file   Social Determinants of Health   Financial Resource Strain: Not on file  Food Insecurity: Not on file  Transportation Needs: Not on file  Physical Activity: Not on file  Stress: Not on file  Social Connections: Not on file  Intimate Partner Violence: Not  on file     Current Outpatient Medications:    etonogestrel (NEXPLANON) 27 MG IMPL implant, 1 each by Subdermal route once., Disp: , Rfl:   Current Facility-Administered Medications:    etonogestrel (NEXPLANON) implant 68 mg, 68 mg, Subdermal, Once, Brandt Chaney B, PA-C     ROS:  Review of Systems BREAST: No symptoms   Objective: There were no vitals taken for this visit.   OBGyn Exam  Results: No results found for this or any previous visit (from the past 24 hour(s)).  Assessment/Plan: No diagnosis found.  No orders of the defined types were placed in this encounter.            GYN counsel {counseling: 16159}     F/U  No follow-ups on file.  Avonelle Viveros B. Eryx Zane, PA-C 02/26/2022 8:56 PM

## 2022-02-27 ENCOUNTER — Encounter: Payer: Self-pay | Admitting: Obstetrics and Gynecology

## 2022-02-27 ENCOUNTER — Other Ambulatory Visit (HOSPITAL_COMMUNITY)
Admission: RE | Admit: 2022-02-27 | Discharge: 2022-02-27 | Disposition: A | Discharge status: Home / Self Care | Payer: Managed Care, Other (non HMO) | Source: Ambulatory Visit | Attending: Obstetrics and Gynecology | Admitting: Obstetrics and Gynecology

## 2022-02-27 ENCOUNTER — Ambulatory Visit: Payer: Managed Care, Other (non HMO) | Admitting: Obstetrics and Gynecology

## 2022-02-27 VITALS — BP 120/80 | Ht 60.0 in | Wt 160.0 lb

## 2022-02-27 DIAGNOSIS — Z01419 Encounter for gynecological examination (general) (routine) without abnormal findings: Secondary | ICD-10-CM

## 2022-02-27 DIAGNOSIS — Z124 Encounter for screening for malignant neoplasm of cervix: Secondary | ICD-10-CM | POA: Diagnosis not present

## 2022-02-27 DIAGNOSIS — Z1151 Encounter for screening for human papillomavirus (HPV): Secondary | ICD-10-CM | POA: Diagnosis present

## 2022-02-27 DIAGNOSIS — Z3046 Encounter for surveillance of implantable subdermal contraceptive: Secondary | ICD-10-CM

## 2022-02-27 NOTE — Patient Instructions (Signed)
I value your feedback and you entrusting us with your care. If you get a  patient survey, I would appreciate you taking the time to let us know about your experience today. Thank you! ? ? ?

## 2022-02-28 LAB — CYTOLOGY - PAP
Comment: NEGATIVE
Diagnosis: NEGATIVE
High risk HPV: NEGATIVE

## 2022-05-19 ENCOUNTER — Other Ambulatory Visit: Payer: Self-pay

## 2022-05-19 ENCOUNTER — Emergency Department
Admission: EM | Admit: 2022-05-19 | Discharge: 2022-05-19 | Disposition: A | Discharge status: Home / Self Care | Payer: Managed Care, Other (non HMO) | Attending: Emergency Medicine | Admitting: Emergency Medicine

## 2022-05-19 DIAGNOSIS — M5416 Radiculopathy, lumbar region: Secondary | ICD-10-CM

## 2022-05-19 DIAGNOSIS — M545 Low back pain, unspecified: Secondary | ICD-10-CM | POA: Diagnosis present

## 2022-05-19 HISTORY — DX: Sciatica, unspecified side: M54.30

## 2022-05-19 LAB — URINALYSIS, W/ REFLEX TO CULTURE (INFECTION SUSPECTED)
Bilirubin Urine: NEGATIVE
Glucose, UA: NEGATIVE mg/dL
Hgb urine dipstick: NEGATIVE
Ketones, ur: NEGATIVE mg/dL
Leukocytes,Ua: NEGATIVE
Nitrite: NEGATIVE
Protein, ur: NEGATIVE mg/dL
Specific Gravity, Urine: 1.025 (ref 1.005–1.030)
pH: 6.5 (ref 5.0–8.0)

## 2022-05-19 LAB — POC URINE PREG, ED: Preg Test, Ur: NEGATIVE

## 2022-05-19 MED ORDER — ACETAMINOPHEN 325 MG PO TABS
650.0000 mg | ORAL_TABLET | Freq: Once | ORAL | Status: AC
  Administered 2022-05-19: 650 mg via ORAL
  Filled 2022-05-19: qty 2

## 2022-05-19 MED ORDER — NAPROXEN 500 MG PO TABS: 500.0000 mg | ORAL_TABLET | Freq: Two times a day (BID) | ORAL | 0 refills | Status: AC

## 2022-05-19 MED ORDER — LIDOCAINE 5 % EX PTCH
1.0000 | MEDICATED_PATCH | CUTANEOUS | Status: DC
  Administered 2022-05-19: 1 via TRANSDERMAL
  Filled 2022-05-19: qty 1

## 2022-05-19 MED ORDER — KETOROLAC TROMETHAMINE 15 MG/ML IJ SOLN
15.0000 mg | Freq: Once | INTRAMUSCULAR | Status: AC
  Administered 2022-05-19: 15 mg via INTRAMUSCULAR
  Filled 2022-05-19: qty 1

## 2022-05-19 MED ORDER — LIDOCAINE 5 % EX PTCH: 1.0000 | MEDICATED_PATCH | Freq: Two times a day (BID) | CUTANEOUS | 0 refills | Status: AC

## 2022-05-19 MED ORDER — PREDNISONE 10 MG (21) PO TBPK: ORAL_TABLET | ORAL | 0 refills | Status: DC

## 2022-05-19 MED ORDER — DEXAMETHASONE SODIUM PHOSPHATE 10 MG/ML IJ SOLN
10.0000 mg | Freq: Once | INTRAMUSCULAR | Status: AC
  Administered 2022-05-19: 10 mg via INTRAVENOUS
  Filled 2022-05-19: qty 1

## 2022-05-19 NOTE — ED Triage Notes (Signed)
Pt to ED for R leg pain and back pain from sciatica. Has pain down R leg since 1 wk. Pt walking with a limp. No PCP. Hx same.

## 2022-05-19 NOTE — ED Provider Notes (Signed)
[facility] Provider Note    Event Date/Time   First MD Initiated Contact with Patient 05/19/22 1227     (approximate)   History   Sciatica   HPI  Teresa Strickland is a 32 y.o. female with a past medical history of sciatica who presents today for evaluation of low back pain with radiation down her right leg.  Patient reports that this feels exactly the same as her previous sciatica exacerbation.  She reports that she is on her feet at work all day and she thinks that this is likely causing her symptoms exacerbating them.  She denies any urinary or fecal incontinence or retention.  No saddle anesthesia.  No fevers or chills.  No abdominal pain.  She is still able to ambulate.  There are no problems to display for this patient.         Physical Exam   Triage Vital Signs: ED Triage Vitals  Enc Vitals Group     BP 05/19/22 1158 (!) 146/90     Pulse Rate 05/19/22 1158 99     Resp 05/19/22 1158 16     Temp 05/19/22 1158 99.7 F (37.6 C)     Temp Source 05/19/22 1158 Oral     SpO2 05/19/22 1158 96 %     Weight 05/19/22 1157 160 lb (72.6 kg)     Height 05/19/22 1157 5\' 5"  (1.651 m)     Head Circumference --      Peak Flow --      Pain Score 05/19/22 1157 9     Pain Loc --      Pain Edu? --      Excl. in GC? --     Most recent vital signs: Vitals:   05/19/22 1158  BP: (!) 146/90  Pulse: 99  Resp: 16  Temp: 99.7 F (37.6 C)  SpO2: 96%    Physical Exam Vitals and nursing note reviewed.  Constitutional:      General: Awake and alert. No acute distress.    Appearance: Normal appearance. The patient is normal weight.  HENT:     Head: Normocephalic and atraumatic.     Mouth: Mucous membranes are moist.  Eyes:     General: PERRL. Normal EOMs        Right eye: No discharge.        Left eye: No discharge.     Conjunctiva/sclera: Conjunctivae normal.  Cardiovascular:     Rate and Rhythm: Normal rate and regular rhythm.     Pulses: Normal  pulses.  Pulmonary:     Effort: Pulmonary effort is normal. No respiratory distress.     Breath sounds: Normal breath sounds.  Abdominal:     Abdomen is soft. There is no abdominal tenderness. No rebound or guarding. No distention. Back: No midline tenderness. Strength and sensation 5/5 to bilateral lower extremities. Normal great toe extension against resistance. Normal sensation throughout feet. Normal patellar reflexes. Positive SLR on the right and negative opposite SLR bilaterally. Negative FABER test Musculoskeletal:        General: No swelling. Normal range of motion.     Cervical back: Normal range of motion and neck supple.  Skin:    General: Skin is warm and dry.     Capillary Refill: Capillary refill takes less than 2 seconds.     Findings: No rash.  Neurological:     Mental Status: The patient is awake and alert.      ED  Results / Procedures / Treatments   Labs (all labs ordered are listed, but only abnormal results are displayed) Labs Reviewed  URINALYSIS, W/ REFLEX TO CULTURE (INFECTION SUSPECTED) - Abnormal; Notable for the following components:      Result Value   APPearance CLEAR (*)    Bacteria, UA RARE (*)    All other components within normal limits  POC URINE PREG, ED     EKG     RADIOLOGY     PROCEDURES:  Critical Care performed:   Procedures   MEDICATIONS ORDERED IN ED: Medications  lidocaine (LIDODERM) 5 % 1 patch (1 patch Transdermal Patch Applied 05/19/22 1312)  ketorolac (TORADOL) 15 MG/ML injection 15 mg (15 mg Intramuscular Given 05/19/22 1313)  dexamethasone (DECADRON) injection 10 mg (10 mg Intravenous Given 05/19/22 1318)  acetaminophen (TYLENOL) tablet 650 mg (650 mg Oral Given 05/19/22 1316)     IMPRESSION / MDM / ASSESSMENT AND PLAN / ED COURSE  I reviewed the triage vital signs and the nursing notes.   Differential diagnosis includes, but is not limited to, lumbar radiculopathy, sciatica, muscle strain, muscle spasm, less  likely UTI or pyelonephritis.  Patient is awake and alert, hemodynamically stable and afebrile.  She is nontoxic in appearance.  This is a 32 year old female with a history of back pain who presents with back pain. 5 out of 5 strength with intact sensation to extensor hallucis dorsiflexion and plantarflexion of bilateral lower extremities with normal patellar reflexes bilaterally. Most likely etiology at this point is muscle strain vs herniated disc. No red flags to indicate patient is at risk for more auspicious process that would require urgent/emergent spinal imaging or subspecialty evaluation at this time. No major trauma, no midline tenderness, no history or physical exam findings to suggest cauda equina syndrome or spinal cord compression. No focal neurological deficits on exam. No constitutional symptoms or history of immunosuppression or IVDA to suggest potential for epidural abscess. Not anticoagulated, no history of bleeding diastasis to suggest risk for epidural hematoma. No chronic steroid use or advanced age or history of malignancy to suggest proclivity towards pathological fracture.  No abdominal pain or flank pain to suggest kidney stone, no history of kidney stone.  No fever or dysuria or CVAT to suggest pyelonephritis, and urinalysis is negative for infection.  No chest pain, back pain, shortness of breath, neurological deficits, to suggest vascular catastrophe, and pulses are equal in all 4 extremities.  Her pregnancy was negative, she was treated symptomatically with improvement of her symptoms.  Discussed care instructions and return precautions with patient. Recommended close outpatient follow-up for re-evaluation. Patient agrees with plan of care. Will treat the patient symptomatically as needed for pain control. Will discharge patient to take these medications and return for any worsening or different pain or development of any neurologic symptoms. Educated patient regarding expected time  course for back pain to improve and recommended very close outpatient follow-up.  Patient understands and agrees with plan.  She was discharged in stable condition.   Patient's presentation is most consistent with acute complicated illness / injury requiring diagnostic workup.    FINAL CLINICAL IMPRESSION(S) / ED DIAGNOSES   Final diagnoses:  Lumbar radiculopathy     Rx / DC Orders   ED Discharge Orders          Ordered    naproxen (NAPROSYN) 500 MG tablet  2 times daily with meals        05/19/22 1333    predniSONE (STERAPRED UNI-PAK  21 TAB) 10 MG (21) TBPK tablet        05/19/22 1333    lidocaine (LIDODERM) 5 %  Every 12 hours        05/19/22 1333             Note:  This document was prepared using Dragon voice recognition software and may include unintentional dictation errors.   Keturah Shavers 05/19/22 1340    Jene Every, MD 05/19/22 1520

## 2022-05-19 NOTE — Discharge Instructions (Addendum)
Please return to the emergency department for any new, worsening, or changing symptoms or other concerns including weakness in your legs, urinary or stool incontinence or retention, numbness or tingling in your extremities/buttocks/groin, fevers, or any other concerns or change in symptoms.  

## 2022-06-06 ENCOUNTER — Other Ambulatory Visit: Payer: Self-pay

## 2022-06-06 ENCOUNTER — Inpatient Hospital Stay
Admission: RE | Admit: 2022-06-06 | Discharge: 2022-06-06 | Disposition: A | Discharge status: Home / Self Care | Payer: Self-pay | Source: Ambulatory Visit | Attending: Orthopedic Surgery | Admitting: Orthopedic Surgery

## 2022-06-06 DIAGNOSIS — Z049 Encounter for examination and observation for unspecified reason: Secondary | ICD-10-CM

## 2022-06-06 NOTE — Progress Notes (Unsigned)
Referring Physician:  No referring provider defined for this encounter.  Primary Physician:  Pcp, No  History of Present Illness: 06/06/2022*** Teresa Strickland is healthy.   Seen in ED on 05/19/22 for back and right leg pain.     Given naprosyn, prednisone, and lidoderm from ED. Previously given prednisone and flexeril from PCP on 05/14/22.    Duration: *** Location: *** Quality: *** Severity: ***  Precipitating: aggravated by *** Modifying factors: made better by *** Weakness: none Timing: *** Bowel/Bladder Dysfunction: none  Conservative measures:  Physical therapy: ***  Multimodal medical therapy including regular antiinflammatories: ***  Injections: *** epidural steroid injections  Past Surgery: ***  Teresa Strickland has ***no symptoms of cervical myelopathy.  The symptoms are causing a significant impact on the patient's life.   Review of Systems:  A 10 point review of systems is negative, except for the pertinent positives and negatives detailed in the HPI.  Past Medical History: Past Medical History:  Diagnosis Date   No pertinent past medical history    Sciatica     Past Surgical History: Past Surgical History:  Procedure Laterality Date   CHOLECYSTECTOMY      Allergies: Allergies as of 06/12/2022 - Review Complete 05/19/2022  Allergen Reaction Noted   Nitrofurantoin Hives 04/16/2019   Piperacillin-tazobactam in dex Rash 08/31/2013    Medications: Outpatient Encounter Medications as of 06/12/2022  Medication Sig   etonogestrel (NEXPLANON) 68 MG IMPL implant 1 each by Subdermal route once.   predniSONE (STERAPRED UNI-PAK 21 TAB) 10 MG (21) TBPK tablet Take 6 tablets by mouth on day 1 and decrease by 1 tablet for each subsequent day   Facility-Administered Encounter Medications as of 06/12/2022  Medication   etonogestrel (NEXPLANON) implant 68 mg    Social History: Social History   Tobacco Use   Smoking status: Never   Smokeless tobacco:  Never  Vaping Use   Vaping Use: Never used  Substance Use Topics   Alcohol use: Yes    Alcohol/week: 5.0 standard drinks of alcohol    Types: 5 Cans of beer per week    Comment: occasional   Drug use: No    Family Medical History: No family history on file.  Physical Examination: There were no vitals filed for this visit.  General: Patient is well developed, well nourished, calm, collected, and in no apparent distress. Attention to examination is appropriate.  Respiratory: Patient is breathing without any difficulty.   NEUROLOGICAL:     Awake, alert, oriented to person, place, and time.  Speech is clear and fluent. Fund of knowledge is appropriate.   Cranial Nerves: Pupils equal round and reactive to light.  Facial tone is symmetric.    *** ROM of cervical spine *** pain *** posterior cervical tenderness. *** tenderness in bilateral trapezial region.   *** ROM of lumbar spine *** pain *** posterior lumbar tenderness.   No abnormal lesions on exposed skin.   Strength: Side Biceps Triceps Deltoid Interossei Grip Wrist Ext. Wrist Flex.  R 5 5 5 5 5 5 5   L 5 5 5 5 5 5 5    Side Iliopsoas Quads Hamstring PF DF EHL  R 5 5 5 5 5 5   L 5 5 5 5 5 5    Reflexes are ***2+ and symmetric at the biceps, triceps, brachioradialis, patella and achilles.   Hoffman's is absent.  Clonus is not present.   Bilateral upper and lower extremity sensation is intact to light touch.  Gait is normal.   ***No difficulty with tandem gait.    Medical Decision Making  Imaging: Lumbar xrays dated 05/14/22:  Mild DDD L5-S1   Assessment and Plan: Ms. Sholes is a pleasant 32 y.o. female has ***  Treatment options discussed with patient and following plan made:   - Order for physical therapy for *** spine ***. Patient to call to schedule appointment. *** - Continue current medications including ***. Reviewed dosing and side effects.  - Prescription for ***. Reviewed dosing and side  effects. Take with food.  - Prescription for *** to take prn muscle spasms. Reviewed dosing and side effects. Discussed this can cause drowsiness.  - MRI of *** to further evaluate *** radiculopathy. No improvement time or medications (***).  - Referral to PMR at [facility] to discuss possible *** injections.  - Will schedule phone visit to review MRI results once I get them back.   I spent a total of *** minutes in face-to-face and non-face-to-face activities related to this patient's care today including review of outside records, review of imaging, review of symptoms, physical exam, discussion of differential diagnosis, discussion of treatment options, and documentation.   Thank you for involving me in the care of this patient.   Drake Leach PA-C Dept. of Neurosurgery

## 2022-06-12 ENCOUNTER — Encounter: Payer: Self-pay | Admitting: Orthopedic Surgery

## 2022-06-12 ENCOUNTER — Ambulatory Visit: Payer: Managed Care, Other (non HMO) | Admitting: Orthopedic Surgery

## 2022-06-12 VITALS — BP 130/81 | Ht 60.0 in | Wt 167.8 lb

## 2022-06-12 DIAGNOSIS — M5136 Other intervertebral disc degeneration, lumbar region: Secondary | ICD-10-CM

## 2022-06-12 DIAGNOSIS — G8929 Other chronic pain: Secondary | ICD-10-CM | POA: Diagnosis not present

## 2022-06-12 DIAGNOSIS — M5441 Lumbago with sciatica, right side: Secondary | ICD-10-CM | POA: Diagnosis not present

## 2022-06-12 DIAGNOSIS — M5416 Radiculopathy, lumbar region: Secondary | ICD-10-CM | POA: Diagnosis not present

## 2022-06-12 NOTE — Patient Instructions (Signed)
It was so nice to see you today. Thank you so much for coming in.    Your lower back xrays showed some wear and tear (degeneration) at L5-S1 and I think this may be causing your pain.   I want to get an MRI of your lower back to look into things further. We will get this approved through your insurance and  [facility] will call you to schedule the appointment.   I sent physical therapy orders to [facility]. You can call them at [phone] if you don't hear from them to schedule your visit.   It will take 5-7 days for me to get the MRI results back once you have it done. Once I have them, we will call you to set up a phone visit to review them.   Please do not hesitate to call if you have any questions or concerns. You can also message me in MyChart.   If you have not heard back about any of the MRI the next week, please call the office so we can help you get it scheduled.   Drake Leach PA-C [phone]

## 2022-06-28 ENCOUNTER — Ambulatory Visit
Admission: RE | Admit: 2022-06-28 | Discharge: 2022-06-28 | Disposition: A | Discharge status: Home / Self Care | Payer: Managed Care, Other (non HMO) | Source: Ambulatory Visit | Attending: Orthopedic Surgery | Admitting: Orthopedic Surgery

## 2022-06-28 DIAGNOSIS — M51369 Other intervertebral disc degeneration, lumbar region without mention of lumbar back pain or lower extremity pain: Secondary | ICD-10-CM

## 2022-06-28 DIAGNOSIS — M5416 Radiculopathy, lumbar region: Secondary | ICD-10-CM

## 2022-06-28 DIAGNOSIS — G8929 Other chronic pain: Secondary | ICD-10-CM

## 2022-06-28 DIAGNOSIS — M5136 Other intervertebral disc degeneration, lumbar region: Secondary | ICD-10-CM

## 2022-07-07 NOTE — Progress Notes (Unsigned)
Telephone Visit- Progress Note: Referring Physician:  Mick Sell, MD [address] [place],  [place] [address]  Primary Physician:  Mick Sell, MD  This visit was performed via telephone.  Patient location: home Provider location: office  I spent a total of 10 minutes non-face-to-face activities for this visit on the date of this encounter including review of current clinical condition and response to treatment.    Patient has given verbal consent to this telephone visits and we reviewed the limitations of a telephone visit. Patient wishes to proceed.    Chief Complaint:  review lumbar MRI results  History of Present Illness: Teresa Strickland is a 32 y.o. female that is healthy.   Last seen by me on 06/12/22 for back and right leg pain. She has known mild DDD L5-S1. Back and leg pain appear lumbar mediated.   She was sent to PT at [facility] and lumbar MRI was ordered.   Phone visit scheduled to review her lumbar MRI results.   She was seen for initial PT evaluation at [facility] on 06/16/22 and has not been back. No change in her symptoms. She continues with constant LBP with right leg pain (entire leg) to her foot. She has numbness, tingling, or weakness in her right leg. LBP = right leg pain. No left leg pain. Pain is worse at night.   No relief with previous medications (naprosyn, prednisone, lidoderm, flexeril).     Conservative measures:  Physical therapy: initial PT evaluation at [facility] on 06/16/22 Multimodal medical therapy including regular antiinflammatories: naproxen, prednisone, lidoderm, flexeril  Injections: No epidural steroid injections   Past Surgery: No spinal surgery.    Teresa Strickland has no symptoms of cervical myelopathy.   The symptoms are causing a significant impact on the patient's life.    Exam: No exam done as this was a telephone encounter.     Imaging: MRI of lumbar spine dated 06/28/22:  FINDINGS: Segmentation:   5 non  rib-bearing lumbar type vertebral bodies are present. The lowest fully formed vertebral body is L5.   Alignment:  Physiologic.   Vertebrae:  No fracture, evidence of discitis, or bone lesion.   Conus medullaris and cauda equina: Conus extends to the L1 level. Conus and cauda equina appear normal.   Paraspinal and other soft tissues: Negative.   Disc levels:   T12-L1: No significant disc bulge. No neural foraminal stenosis. No central canal stenosis.   L1-L2: No significant disc bulge. No neural foraminal stenosis. No central canal stenosis.   L2-L3: No significant disc bulge. No neural foraminal stenosis. No central canal stenosis.   L3-L4: No significant disc bulge. No neural foraminal stenosis. No central canal stenosis.   L4-L5: No significant disc bulge. No neural foraminal stenosis. No central canal stenosis. Mild facet joint arthropathy.   L5-S1: Right paracentral disc extrusion with marked right lateral recess stenosis. Mild left lateral recess stenosis. Mild right neural foraminal stenosis   IMPRESSION: 1. No evidence of fracture or subluxation. 2. L5-S1 right paracentral disc extrusion with marked right lateral recess stenosis and mild right neural foraminal stenosis.     Electronically Signed   By: Larose Hires D.O.   On: 06/28/2022 23:36       I have personally reviewed the images and agree with the above interpretation.   Assessment and Plan: Teresa Strickland is a pleasant 32 y.o. female continues with constant LBP with right leg pain (entire leg) to her foot. She has numbness, tingling, or  weakness in her right leg. LBP = right leg pain. No left leg pain. Pain is worse at night.    She has known mild DDD L5-S1 with right paracentral disc extrusion with marked right lateral recess stenosis and mild right foraminal stenosis.    Treatment options discussed with patient and following plan made:   - Continue with PT at [facility]. She will make follow up  appointment.  - Referral to PMR at [facility] to discuss lumbar injections.  - Consider trial of neurotin if no improvement with above.  - Follow up with me in 6-8 weeks for recheck.    Drake Leach PA-C Neurosurgery

## 2022-07-08 ENCOUNTER — Ambulatory Visit: Payer: Managed Care, Other (non HMO) | Admitting: Orthopedic Surgery

## 2022-07-08 ENCOUNTER — Encounter: Payer: Self-pay | Admitting: Orthopedic Surgery

## 2022-07-08 DIAGNOSIS — M5137 Other intervertebral disc degeneration, lumbosacral region: Secondary | ICD-10-CM | POA: Diagnosis not present

## 2022-07-08 DIAGNOSIS — M4807 Spinal stenosis, lumbosacral region: Secondary | ICD-10-CM | POA: Diagnosis not present

## 2022-07-08 DIAGNOSIS — M5136 Other intervertebral disc degeneration, lumbar region: Secondary | ICD-10-CM

## 2022-07-08 DIAGNOSIS — M5126 Other intervertebral disc displacement, lumbar region: Secondary | ICD-10-CM

## 2022-07-08 DIAGNOSIS — M5416 Radiculopathy, lumbar region: Secondary | ICD-10-CM

## 2022-08-25 ENCOUNTER — Ambulatory Visit: Payer: Managed Care, Other (non HMO) | Admitting: Orthopedic Surgery

## 2022-08-25 NOTE — Progress Notes (Deleted)
Referring Physician:  Mick Sell, MD [address] [place],  [place] [address]  Primary Physician:  Mick Sell, MD  History of Present Illness: 08/25/2022 Teresa Strickland is healthy.   Seen in ED on 05/19/22 for back and right leg pain.   Over 4 year history of intermittent LBP and right leg pain. This episode started a month ago and has not gotten better. She has constant LBP with right leg pain (entire leg) to her foot. She has numbness, tingling, or weakness in her right leg. LBP = right leg pain. No left leg pain. No aggravating or alleviating factors.   Given naprosyn, prednisone, and lidoderm from ED. Previously given prednisone and flexeril from PCP on 05/14/22. Not much relief with medications.   Bowel/Bladder Dysfunction: none  Conservative measures:  Physical therapy: scheduled to start PT at [facility] on 07/14/22 Multimodal medical therapy including regular antiinflammatories: naproxen, prednisone, lidoderm, flexeril  Injections: No epidural steroid injections  Past Surgery: No spinal surgery.   Teresa Strickland has no symptoms of cervical myelopathy.  The symptoms are causing a significant impact on the patient's life.   Review of Systems:  A 10 point review of systems is negative, except for the pertinent positives and negatives detailed in the HPI.  Past Medical History: Past Medical History:  Diagnosis Date   No pertinent past medical history    Sciatica     Past Surgical History: Past Surgical History:  Procedure Laterality Date   CHOLECYSTECTOMY      Allergies: Allergies as of 08/25/2022 - Review Complete 07/08/2022  Allergen Reaction Noted   Nitrofurantoin Hives 04/16/2019   Piperacillin-tazobactam in dex Rash 08/31/2013    Medications: Outpatient Encounter Medications as of 08/25/2022  Medication Sig   etonogestrel (NEXPLANON) 68 MG IMPL implant 1 each by Subdermal route once.   Facility-Administered Encounter Medications as of  08/25/2022  Medication   etonogestrel (NEXPLANON) implant 68 mg    Social History: Social History   Tobacco Use   Smoking status: Never   Smokeless tobacco: Never  Vaping Use   Vaping status: Never Used  Substance Use Topics   Alcohol use: Yes    Alcohol/week: 5.0 standard drinks of alcohol    Types: 5 Cans of beer per week    Comment: occasional   Drug use: No    Family Medical History: No family history on file.  Physical Examination: There were no vitals filed for this visit.   General: Patient is well developed, well nourished, calm, collected, and in no apparent distress. Attention to examination is appropriate.  Respiratory: Patient is breathing without any difficulty.   NEUROLOGICAL:     Awake, alert, oriented to person, place, and time.  Speech is clear and fluent. Fund of knowledge is appropriate.   Cranial Nerves: Pupils equal round and reactive to light.  Facial tone is symmetric.    Minimal lower posterior lumbar tenderness.   No abnormal lesions on exposed skin.   Strength: Side Biceps Triceps Deltoid Interossei Grip Wrist Ext. Wrist Flex.  R 5 5 5 5 5 5 5   L 5 5 5 5 5 5 5    Side Iliopsoas Quads Hamstring PF DF EHL  R 5 5 5 5 5 5   L 5 5 5 5 5 5    Reflexes are 2+ and symmetric at the biceps, triceps, brachioradialis, patella and achilles.   Hoffman's is absent.  Clonus is not present.   Bilateral upper and lower extremity sensation is  intact to light touch.     Gait is normal.    Medical Decision Making  Imaging: Lumbar xrays dated 05/14/22:  Mild DDD L5-S1   Assessment and Plan: Teresa Strickland is a pleasant 32 y.o. female has 4 year history of intermittent LBP and right leg pain. Current episode started a month ago and has not gotten better. She has constant LBP with right leg pain (entire leg) to her foot. She has numbness, tingling, or weakness in her right leg. LBP = right leg pain. No left leg pain.   She has known mild DDD L5-S1. Back  and leg pain appear lumbar mediated.   Treatment options discussed with patient and following plan made:   - Order for physical therapy for lumbar spine sent to [facility]. Patient to call to schedule appointment.  - MRI of lumbar to further evaluate lumbar radiculopathy. No improvement with, time or medications (prednisone, naproxen, flexeril).  - Will schedule phone visit to review MRI results once I get them back.   I spent a total of 25 minutes in face-to-face and non-face-to-face activities related to this patient's care today including review of outside records, review of imaging, review of symptoms, physical exam, discussion of differential diagnosis, discussion of treatment options, and documentation.   Thank you for involving me in the care of this patient.   Drake Leach PA-C Dept. of Neurosurgery

## 2022-09-05 NOTE — Progress Notes (Deleted)
Referring Physician:  No referring provider defined for this encounter.  Primary Physician:  Mick Sell, MD  History of Present Illness: 09/05/2022 Teresa Strickland is healthy.   She has known mild DDD L5-S1 with right paracentral disc extrusion with marked right lateral recess stenosis and mild right foraminal stenosis.   Last did phone visit with me on 07/08/22 to review her lumbar MRI scan.   She was to continue PT and sent to PMR to discuss injections.   She had right L5-S1 TF and right S1 TF ESI with Dr. Yves Dill on 08/27/22.          Seen in ED on 05/19/22 for back and right leg pain.   Over 4 year history of intermittent LBP and right leg pain. This episode started a month ago and has not gotten better. She has constant LBP with right leg pain (entire leg) to her foot. She has numbness, tingling, or weakness in her right leg. LBP = right leg pain. No left leg pain. No aggravating or alleviating factors.   Given naprosyn, prednisone, and lidoderm from ED. Previously given prednisone and flexeril from PCP on 05/14/22. Not much relief with medications.   Bowel/Bladder Dysfunction: none  Conservative measures:  Physical therapy:  initial PT evaluation at [facility] on 06/16/22  Multimodal medical therapy including regular antiinflammatories: naproxen, prednisone, lidoderm, flexeril  Injections:  Right L5-S1 TF and right S1 TF ESI with Dr. Yves Dill on 08/27/22  Past Surgery: No spinal surgery.   Teresa Strickland has no symptoms of cervical myelopathy.  The symptoms are causing a significant impact on the patient's life.   Review of Systems:  A 10 point review of systems is negative, except for the pertinent positives and negatives detailed in the HPI.  Past Medical History: Past Medical History:  Diagnosis Date   No pertinent past medical history    Sciatica     Past Surgical History: Past Surgical History:  Procedure Laterality Date   CHOLECYSTECTOMY       Allergies: Allergies as of 09/10/2022 - Review Complete 07/08/2022  Allergen Reaction Noted   Nitrofurantoin Hives 04/16/2019   Piperacillin-tazobactam in dex Rash 08/31/2013    Medications: Outpatient Encounter Medications as of 09/10/2022  Medication Sig   etonogestrel (NEXPLANON) 68 MG IMPL implant 1 each by Subdermal route once.   Facility-Administered Encounter Medications as of 09/10/2022  Medication   etonogestrel (NEXPLANON) implant 68 mg    Social History: Social History   Tobacco Use   Smoking status: Never   Smokeless tobacco: Never  Vaping Use   Vaping status: Never Used  Substance Use Topics   Alcohol use: Yes    Alcohol/week: 5.0 standard drinks of alcohol    Types: 5 Cans of beer per week    Comment: occasional   Drug use: No    Family Medical History: No family history on file.  Physical Examination: There were no vitals filed for this visit.  Awake, alert, oriented to person, place, and time.  Speech is clear and fluent. Fund of knowledge is appropriate.   Cranial Nerves: Pupils equal round and reactive to light.  Facial tone is symmetric.    Minimal lower posterior lumbar tenderness.   No abnormal lesions on exposed skin.   Strength: Side Biceps Triceps Deltoid Interossei Grip Wrist Ext. Wrist Flex.  R 5 5 5 5 5 5 5   L 5 5 5 5 5 5 5    Side Iliopsoas Quads Hamstring PF DF EHL  R 5 5 5 5 5 5   L 5 5 5 5 5 5    Reflexes are 2+ and symmetric at the biceps, triceps, brachioradialis, patella and achilles.   Hoffman's is absent.  Clonus is not present.   Bilateral upper and lower extremity sensation is intact to light touch.     Gait is normal.    Medical Decision Making  Imaging: No new imaging.    Assessment and Plan: Teresa Strickland is a pleasant 32 y.o. female with constant LBP with right leg pain (entire leg) to her foot. She has numbness, tingling, or weakness in her right leg. LBP = right leg pain. No left leg pain. Pain is worse at  night.    She has known mild DDD L5-S1 with right paracentral disc extrusion with marked right lateral recess stenosis and mild right foraminal stenosis.    Treatment options discussed with patient and following plan made:    - Continue with PT at [facility]. She will make follow up appointment.  - Referral to PMR at [facility] to discuss lumbar injections.  - Consider trial of neurotin if no improvement with above.  - Follow up with me in 6-8 weeks for recheck.           has 4 year history of intermittent LBP and right leg pain. Current episode started a month ago and has not gotten better. She has constant LBP with right leg pain (entire leg) to her foot. She has numbness, tingling, or weakness in her right leg. LBP = right leg pain. No left leg pain.   She has known mild DDD L5-S1. Back and leg pain appear lumbar mediated.   Treatment options discussed with patient and following plan made:   - Order for physical therapy for lumbar spine sent to [facility]. Patient to call to schedule appointment.  - MRI of lumbar to further evaluate lumbar radiculopathy. No improvement with, time or medications (prednisone, naproxen, flexeril).  - Will schedule phone visit to review MRI results once I get them back.   I spent a total of *** minutes in face-to-face and non-face-to-face activities related to this patient's care today including review of outside records, review of imaging, review of symptoms, physical exam, discussion of differential diagnosis, discussion of treatment options, and documentation.   Thank you for involving me in the care of this patient.   Drake Leach PA-C Dept. of Neurosurgery

## 2022-09-10 ENCOUNTER — Ambulatory Visit: Payer: Managed Care, Other (non HMO) | Admitting: Orthopedic Surgery

## 2022-10-07 ENCOUNTER — Encounter: Payer: Self-pay | Admitting: Orthopedic Surgery

## 2023-07-07 ENCOUNTER — Other Ambulatory Visit: Payer: Self-pay | Admitting: Surgery

## 2023-07-14 ENCOUNTER — Encounter
Admission: RE | Admit: 2023-07-14 | Discharge: 2023-07-14 | Disposition: A | Discharge status: Home / Self Care | Source: Ambulatory Visit | Attending: Surgery | Admitting: Surgery

## 2023-07-14 ENCOUNTER — Other Ambulatory Visit: Payer: Self-pay

## 2023-07-14 DIAGNOSIS — Z01812 Encounter for preprocedural laboratory examination: Secondary | ICD-10-CM

## 2023-07-14 HISTORY — DX: Carpal tunnel syndrome, bilateral upper limbs: G56.03

## 2023-07-14 HISTORY — DX: Other intervertebral disc displacement, lumbar region: M51.26

## 2023-07-14 HISTORY — DX: Radiculopathy, lumbar region: M54.16

## 2023-07-14 HISTORY — DX: Sciatica, right side: M54.31

## 2023-07-14 HISTORY — DX: Other intervertebral disc degeneration, lumbar region without mention of lumbar back pain or lower extremity pain: M51.369

## 2023-07-14 NOTE — Patient Instructions (Addendum)
 Your procedure is scheduled on: 07/22/23 - Wednesday Report to the Registration Desk on the 1st floor of the [facility]. To find out your arrival time, please call [phone] between 1PM - 3PM on: 07/21/23 - Tuesday If your arrival time is 6:00 am, do not arrive before that time as the [facility] entrance doors do not open until 6:00 am.  REMEMBER: Instructions that are not followed completely may result in serious medical risk, up to and including death; or upon the discretion of your surgeon and anesthesiologist your surgery may need to be rescheduled.  Do not eat food after midnight the night before surgery.  No gum chewing or hard candies.  You may however, drink CLEAR liquids up to 2 hours before you are scheduled to arrive for your surgery. Do not drink anything within 2 hours of your scheduled arrival time.  Clear liquids include: - water  - apple juice without pulp - gatorade (not RED colors) - black coffee or tea (Do NOT add milk or creamers to the coffee or tea) Do NOT drink anything that is not on this list.  In addition, your doctor has ordered for you to drink the provided:  Ensure Pre-Surgery Clear Carbohydrate Drink  Drinking this carbohydrate drink up to two hours before surgery helps to reduce insulin resistance and improve patient outcomes. Please complete drinking 2 hours before scheduled arrival time.  One week prior to surgery: Stop Anti-inflammatories (NSAIDS) such as Advil, Aleve , Ibuprofen, Motrin, Naproxen , Naprosyn  and Aspirin based products such as Excedrin, Goody's Powder, BC Powder. You may take Tylenol  if needed for pain up until the day of surgery.  Stop ANY OVER THE COUNTER supplements until after surgery.  ON THE DAY OF SURGERY ONLY TAKE THESE MEDICATIONS WITH SIPS OF WATER:  none   No Alcohol for 24 hours before or after surgery.  No Smoking including e-cigarettes for 24 hours before surgery.  No chewable tobacco products for at least 6  hours before surgery.  No nicotine patches on the day of surgery.  Do not use any recreational drugs for at least a week (preferably 2 weeks) before your surgery.  Please be advised that the combination of cocaine and anesthesia may have negative outcomes, up to and including death. If you test positive for cocaine, your surgery will be cancelled.  On the morning of surgery brush your teeth with toothpaste and water, you may rinse your mouth with mouthwash if you wish. Do not swallow any toothpaste or mouthwash.  Use CHG Soap or wipes as directed on instruction sheet.  Do not wear jewelry, make-up, hairpins, clips or nail polish.  For welded (permanent) jewelry: bracelets, anklets, waist bands, etc.  Please have this removed prior to surgery.  If it is not removed, there is a chance that hospital personnel will need to cut it off on the day of surgery.  Do not wear lotions, powders, or perfumes.   Do not shave body hair from the neck down 48 hours before surgery.  Contact lenses, hearing aids and dentures may not be worn into surgery.  Do not bring valuables to the hospital. [facility] is not responsible for any missing/lost belongings or valuables.   Notify your doctor if there is any change in your medical condition (cold, fever, infection).  Wear comfortable clothing (specific to your surgery type) to the hospital.  After surgery, you can help prevent lung complications by doing breathing exercises.  Take deep breaths and cough every 1-2 hours.  Your doctor may order a device called an Incentive Spirometer to help you take deep breaths.  When coughing or sneezing, hold a pillow firmly against your incision with both hands. This is called "splinting." Doing this helps protect your incision. It also decreases belly discomfort.  If you are being admitted to the hospital overnight, leave your suitcase in the car. After surgery it may be brought to your room.  In case of increased  patient census, it may be necessary for you, the patient, to continue your postoperative care in the Same Day Surgery department.  If you are being discharged the day of surgery, you will not be allowed to drive home. You will need a responsible individual to drive you home and stay with you for 24 hours after surgery.   If you are taking public transportation, you will need to have a responsible individual with you.  Please call the Pre-admissions Testing Dept. at [phone] if you have any questions about these instructions.  Surgery Visitation Policy:  Patients having surgery or a procedure may have two visitors.  Children under the age of 65 must have an adult with them who is not the patient.  Inpatient Visitation:    Visiting hours are 7 a.m. to 8 p.m. Up to four visitors are allowed at one time in a patient room. The visitors may rotate out with other people during the day.  One visitor age 59 or older may stay with the patient overnight and must be in the room by 8 p.m.   Merchandiser, retail to address health-related social needs:  [email or web address]    Preparing for Surgery with CHLORHEXIDINE GLUCONATE (CHG) Soap  Chlorhexidine Gluconate (CHG) Soap  o An antiseptic cleaner that kills germs and bonds with the skin to continue killing germs even after washing  o Used for showering the night before surgery and morning of surgery  Before surgery, you can play an important role by reducing the number of germs on your skin.  CHG (Chlorhexidine gluconate) soap is an antiseptic cleanser which kills germs and bonds with the skin to continue killing germs even after washing.  Please do not use if you have an allergy to CHG or antibacterial soaps. If your skin becomes reddened/irritated stop using the CHG.  1. Shower the NIGHT BEFORE SURGERY and the MORNING OF SURGERY with CHG soap.  2. If you choose to wash your hair, wash your hair first as usual  with your normal shampoo.  3. After shampooing, rinse your hair and body thoroughly to remove the shampoo.  4. Use CHG as you would any other liquid soap. You can apply CHG directly to the skin and wash gently with a scrungie or a clean washcloth.  5. Apply the CHG soap to your body only from the neck down. Do not use on open wounds or open sores. Avoid contact with your eyes, ears, mouth, and genitals (private parts). Wash face and genitals (private parts) with your normal soap.  6. Wash thoroughly, paying special attention to the area where your surgery will be performed.  7. Thoroughly rinse your body with warm water.  8. Do not shower/wash with your normal soap after using and rinsing off the CHG soap.  9. Pat yourself dry with a clean towel.  10. Wear clean pajamas to bed the night before surgery.  12. Place clean sheets on your bed the night of your first shower and do not sleep with pets.  13. Shower again with  the CHG soap on the day of surgery prior to arriving at the hospital.  14. Do not apply any deodorants/lotions/powders.  15. Please wear clean clothes to the hospital.

## 2023-07-22 ENCOUNTER — Ambulatory Visit: Payer: Self-pay | Admitting: Urgent Care

## 2023-07-22 ENCOUNTER — Ambulatory Visit: Admitting: Anesthesiology

## 2023-07-22 ENCOUNTER — Encounter: Payer: Self-pay | Admitting: Surgery

## 2023-07-22 ENCOUNTER — Other Ambulatory Visit: Payer: Self-pay

## 2023-07-22 ENCOUNTER — Encounter
Admission: RE | Disposition: A | Discharge status: Home / Self Care | Payer: Self-pay | Source: Home / Self Care | Attending: Surgery

## 2023-07-22 ENCOUNTER — Ambulatory Visit
Admission: RE | Admit: 2023-07-22 | Discharge: 2023-07-22 | Disposition: A | Discharge status: Home / Self Care | Attending: Surgery | Admitting: Surgery

## 2023-07-22 DIAGNOSIS — Z01812 Encounter for preprocedural laboratory examination: Secondary | ICD-10-CM

## 2023-07-22 DIAGNOSIS — G5602 Carpal tunnel syndrome, left upper limb: Secondary | ICD-10-CM | POA: Insufficient documentation

## 2023-07-22 HISTORY — PX: CARPAL TUNNEL RELEASE: SHX101

## 2023-07-22 LAB — POCT PREGNANCY, URINE: Preg Test, Ur: NEGATIVE

## 2023-07-22 SURGERY — RELEASE, CARPAL TUNNEL, ENDOSCOPIC
Anesthesia: General | Site: Wrist | Laterality: Left

## 2023-07-22 MED ORDER — BUPIVACAINE HCL (PF) 0.5 % IJ SOLN
INTRAMUSCULAR | Status: AC
  Filled 2023-07-22: qty 30

## 2023-07-22 MED ORDER — LIDOCAINE HCL (CARDIAC) PF 100 MG/5ML IV SOSY
PREFILLED_SYRINGE | INTRAVENOUS | Status: DC | PRN
  Administered 2023-07-22: 80 mg via INTRAVENOUS

## 2023-07-22 MED ORDER — PROPOFOL 10 MG/ML IV BOLUS
INTRAVENOUS | Status: DC | PRN
  Administered 2023-07-22: 200 mg via INTRAVENOUS

## 2023-07-22 MED ORDER — BUPIVACAINE HCL (PF) 0.5 % IJ SOLN
INTRAMUSCULAR | Status: DC | PRN
  Administered 2023-07-22: 10 mL

## 2023-07-22 MED ORDER — CEFAZOLIN SODIUM-DEXTROSE 2-4 GM/100ML-% IV SOLN
2.0000 g | INTRAVENOUS | Status: AC
  Administered 2023-07-22: 2 g via INTRAVENOUS

## 2023-07-22 MED ORDER — MIDAZOLAM HCL 2 MG/2ML IJ SOLN
INTRAMUSCULAR | Status: AC
Start: 2023-07-22 — End: 2023-07-22
  Filled 2023-07-22: qty 2

## 2023-07-22 MED ORDER — ACETAMINOPHEN 10 MG/ML IV SOLN: 1000.0000 mg | Freq: Once | INTRAVENOUS | Status: DC | PRN

## 2023-07-22 MED ORDER — HYDROCODONE-ACETAMINOPHEN 5-325 MG PO TABS: 1.0000 | ORAL_TABLET | Freq: Four times a day (QID) | ORAL | 0 refills | Status: AC | PRN

## 2023-07-22 MED ORDER — FENTANYL CITRATE (PF) 100 MCG/2ML IJ SOLN
INTRAMUSCULAR | Status: AC
Start: 2023-07-22 — End: 2023-07-22
  Filled 2023-07-22: qty 2

## 2023-07-22 MED ORDER — CEFAZOLIN SODIUM-DEXTROSE 2-4 GM/100ML-% IV SOLN
INTRAVENOUS | Status: AC
  Filled 2023-07-22: qty 100

## 2023-07-22 MED ORDER — 0.9 % SODIUM CHLORIDE (POUR BTL) OPTIME
TOPICAL | Status: DC | PRN
Start: 2023-07-22 — End: 2023-07-22
  Administered 2023-07-22: 250 mL

## 2023-07-22 MED ORDER — ONDANSETRON HCL 4 MG/2ML IJ SOLN
INTRAMUSCULAR | Status: AC
  Filled 2023-07-22: qty 2

## 2023-07-22 MED ORDER — ORAL CARE MOUTH RINSE: 15.0000 mL | Freq: Once | OROMUCOSAL | Status: AC

## 2023-07-22 MED ORDER — HYDROCODONE-ACETAMINOPHEN 5-325 MG PO TABS: 1.0000 | ORAL_TABLET | ORAL | Status: DC | PRN

## 2023-07-22 MED ORDER — METOCLOPRAMIDE HCL 10 MG PO TABS: 5.0000 mg | ORAL_TABLET | Freq: Three times a day (TID) | ORAL | Status: DC | PRN

## 2023-07-22 MED ORDER — LIDOCAINE HCL (PF) 2 % IJ SOLN
INTRAMUSCULAR | Status: AC
Start: 2023-07-22 — End: 2023-07-22
  Filled 2023-07-22: qty 5

## 2023-07-22 MED ORDER — OXYCODONE HCL 5 MG PO TABS
ORAL_TABLET | ORAL | Status: AC
  Filled 2023-07-22: qty 1

## 2023-07-22 MED ORDER — KETOROLAC TROMETHAMINE 30 MG/ML IJ SOLN
30.0000 mg | Freq: Once | INTRAMUSCULAR | Status: AC
  Administered 2023-07-22: 30 mg via INTRAVENOUS

## 2023-07-22 MED ORDER — OXYCODONE HCL 5 MG PO TABS
5.0000 mg | ORAL_TABLET | Freq: Once | ORAL | Status: AC | PRN
  Administered 2023-07-22: 5 mg via ORAL

## 2023-07-22 MED ORDER — PROPOFOL 1000 MG/100ML IV EMUL
INTRAVENOUS | Status: AC
  Filled 2023-07-22: qty 100

## 2023-07-22 MED ORDER — OXYCODONE HCL 5 MG/5ML PO SOLN: 5.0000 mg | Freq: Once | ORAL | Status: AC | PRN

## 2023-07-22 MED ORDER — METOCLOPRAMIDE HCL 5 MG/ML IJ SOLN: 5.0000 mg | Freq: Three times a day (TID) | INTRAMUSCULAR | Status: DC | PRN

## 2023-07-22 MED ORDER — LACTATED RINGERS IV SOLN: INTRAVENOUS | Status: DC

## 2023-07-22 MED ORDER — PROPOFOL 500 MG/50ML IV EMUL
INTRAVENOUS | Status: DC | PRN
  Administered 2023-07-22: 125 ug/kg/min via INTRAVENOUS

## 2023-07-22 MED ORDER — ACETAMINOPHEN 325 MG PO TABS: 325.0000 mg | ORAL_TABLET | Freq: Four times a day (QID) | ORAL | Status: DC | PRN

## 2023-07-22 MED ORDER — ONDANSETRON HCL 4 MG/2ML IJ SOLN
4.0000 mg | Freq: Four times a day (QID) | INTRAMUSCULAR | Status: DC | PRN
Start: 2023-07-22 — End: 2023-07-22

## 2023-07-22 MED ORDER — ONDANSETRON HCL 4 MG PO TABS: 4.0000 mg | ORAL_TABLET | Freq: Four times a day (QID) | ORAL | Status: DC | PRN

## 2023-07-22 MED ORDER — CHLORHEXIDINE GLUCONATE 0.12 % MT SOLN
15.0000 mL | Freq: Once | OROMUCOSAL | Status: AC
  Administered 2023-07-22: 15 mL via OROMUCOSAL

## 2023-07-22 MED ORDER — MIDAZOLAM HCL 2 MG/2ML IJ SOLN
INTRAMUSCULAR | Status: DC | PRN
  Administered 2023-07-22 (×2): 2 mg via INTRAVENOUS

## 2023-07-22 MED ORDER — KETOROLAC TROMETHAMINE 30 MG/ML IJ SOLN
INTRAMUSCULAR | Status: AC
  Filled 2023-07-22: qty 1

## 2023-07-22 MED ORDER — CHLORHEXIDINE GLUCONATE 0.12 % MT SOLN
OROMUCOSAL | Status: AC
  Filled 2023-07-22: qty 15

## 2023-07-22 MED ORDER — DEXAMETHASONE SODIUM PHOSPHATE 10 MG/ML IJ SOLN
INTRAMUSCULAR | Status: AC
  Filled 2023-07-22: qty 1

## 2023-07-22 MED ORDER — FENTANYL CITRATE (PF) 100 MCG/2ML IJ SOLN: 25.0000 ug | INTRAMUSCULAR | Status: DC | PRN

## 2023-07-22 MED ORDER — DEXAMETHASONE SODIUM PHOSPHATE 10 MG/ML IJ SOLN
INTRAMUSCULAR | Status: DC | PRN
  Administered 2023-07-22: 4 mg via INTRAVENOUS

## 2023-07-22 MED ORDER — SODIUM CHLORIDE 0.9 % IV SOLN: INTRAVENOUS | Status: DC

## 2023-07-22 MED ORDER — PROPOFOL 10 MG/ML IV BOLUS
INTRAVENOUS | Status: AC
  Filled 2023-07-22: qty 20

## 2023-07-22 MED ORDER — MIDAZOLAM HCL 2 MG/2ML IJ SOLN
INTRAMUSCULAR | Status: AC
  Filled 2023-07-22: qty 2

## 2023-07-22 MED ORDER — DROPERIDOL 2.5 MG/ML IJ SOLN: 0.6250 mg | Freq: Once | INTRAMUSCULAR | Status: DC | PRN

## 2023-07-22 MED ORDER — ONDANSETRON HCL 4 MG/2ML IJ SOLN
INTRAMUSCULAR | Status: DC | PRN
Start: 2023-07-22 — End: 2023-07-22
  Administered 2023-07-22: 4 mg via INTRAVENOUS

## 2023-07-22 SURGICAL SUPPLY — 30 items
BNDG COHESIVE 4X5 TAN STRL LF (GAUZE/BANDAGES/DRESSINGS) ×1 IMPLANT
BNDG ELASTIC 2INX 5YD STR LF (GAUZE/BANDAGES/DRESSINGS) ×1 IMPLANT
BNDG ESMARCH 4X12 STRL LF (GAUZE/BANDAGES/DRESSINGS) ×1 IMPLANT
CHLORAPREP W/TINT 26 (MISCELLANEOUS) ×1 IMPLANT
CORD BIP STRL DISP 12FT (MISCELLANEOUS) ×1 IMPLANT
CUFF TOURN SGL QUICK 18X4 (TOURNIQUET CUFF) ×1 IMPLANT
DRAPE SURG 17X11 SM STRL (DRAPES) ×1 IMPLANT
FORCEPS JEWEL BIP 4-3/4 STR (INSTRUMENTS) ×1 IMPLANT
GAUZE SPONGE 4X4 12PLY STRL (GAUZE/BANDAGES/DRESSINGS) ×1 IMPLANT
GAUZE XEROFORM 1X8 LF (GAUZE/BANDAGES/DRESSINGS) ×1 IMPLANT
GLOVE BIO SURGEON STRL SZ8 (GLOVE) ×1 IMPLANT
GLOVE INDICATOR 8.0 STRL GRN (GLOVE) ×1 IMPLANT
GOWN STRL REUS W/ TWL LRG LVL3 (GOWN DISPOSABLE) ×1 IMPLANT
GOWN STRL REUS W/ TWL XL LVL3 (GOWN DISPOSABLE) ×1 IMPLANT
KIT ESCP INSRT D SLOT CANN KN (MISCELLANEOUS) ×1 IMPLANT
KIT TURNOVER KIT A (KITS) ×1 IMPLANT
MANIFOLD NEPTUNE II (INSTRUMENTS) ×1 IMPLANT
NS IRRIG 500ML POUR BTL (IV SOLUTION) ×1 IMPLANT
PACK EXTREMITY ARMC (MISCELLANEOUS) ×1 IMPLANT
PENCIL SMOKE EVACUATOR (MISCELLANEOUS) ×1 IMPLANT
SPLINT WRIST LG LT TX990309 (SOFTGOODS) IMPLANT
SPLINT WRIST LG RT TX900304 (SOFTGOODS) IMPLANT
SPLINT WRIST M LT TX990308 (SOFTGOODS) IMPLANT
SPLINT WRIST M RT TX990303 (SOFTGOODS) IMPLANT
SPLINT WRIST XL LT TX990310 (SOFTGOODS) IMPLANT
SPLINT WRIST XL RT TX990305 (SOFTGOODS) IMPLANT
STOCKINETTE IMPERVIOUS 9X36 MD (GAUZE/BANDAGES/DRESSINGS) ×1 IMPLANT
SUT PROLENE 4 0 PS 2 18 (SUTURE) ×1 IMPLANT
TRAP FLUID SMOKE EVACUATOR (MISCELLANEOUS) IMPLANT
WATER STERILE IRR 500ML POUR (IV SOLUTION) IMPLANT

## 2023-07-22 NOTE — H&P (Signed)
 History of Present Illness: Teresa Strickland is a 33 year old female with carpal tunnel syndrome who presents with worsening numbness and pain in her hand.  She experiences numbness and pain in her hand, particularly at the wrist, with pain radiating to her index fingers. The sensation is described as 'electricity' traveling up to her fingers when her wrist is tapped. Symptoms have worsened, especially when her wrist is held in a downward position, exacerbating the pain within four seconds.  She received a corticosteroid injection that provided relief for a couple of months, but symptoms have since returned. She has tried using braces in the past. 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 Activities requiring her hands to be in certain positions, such as working at a computer or holding a phone, also worsen her symptoms. The numbness and pain occasionally wake her at night, although this has improved since receiving the injection.  She has undergone a nerve conduction study and ultrasound, which showed an enlarged nerve.  Review of Systems: A comprehensive 14 point ROS was performed, reviewed, and the pertinent orthopaedic findings are documented in the HPI.   Past Medical History:  Sciatica of right side   Outpatient Medications:  ergocalciferol, vitamin D2, 1,250 mcg (50,000 unit) capsule Take 1 capsule (50,000 Units total) by mouth once a week for 90 days 12 capsule 0  etonogestreL  (NEXPLANON ) 68 mg implant Inject subcutaneously  gabapentin (NEURONTIN) 300 MG capsule Take 1 capsule (300 mg total) by mouth 3 (three) times daily TAKE 1 CAPSULE BY MOUTH TWICE DAILY 270 capsule 3  traZODone (DESYREL) 50 MG tablet Take 1 tablet (50 mg total) by mouth at bedtime 90 tablet 3   Physical Exam: Vitals:  06/26/23 0807 06/26/23 0812  BP: (!) 140/90  Weight: 77.1 kg (170 lb)  Height: 152.4  cm (5')  PainSc: 8 8  PainLoc: Wrist Wrist   Body mass index is 33.2 kg/m.  Physical Exam: HEENT: Normocephalic, normal oropharynx, moist mucous membranes CHEST: Clear to auscultation bilaterally, no wheezes, rhonchi, or crackles CARDIOVASCULAR: Normal heart rate and rhythm, S1 and S2 normal without murmurs MUSCULOSKELETAL: Positive Tinel's sign and Phalen's test at wrist, no thenar atrophy Sensation intact initially, some decrease with phelan's test  Constitutional: alert, in NAD, and communicates well  Imaging: Nerve ultrasound: Significant median nerve enlargement noted.  Assessment: Carpal Tunnel Syndrome, left wrist.  Plan:  Chronic carpal tunnel syndrome in the left wrist presents with numbness and pain, especially in the index fingers. Positive Tinel's sign and Phalen's test confirm the diagnosis. Previous corticosteroid injection provided temporary relief, suggesting surgical intervention may be beneficial. There is no muscle atrophy. Her occupation involves repetitive manual tasks, which may worsen symptoms. Nerve test shows significant nerve enlargement. Surgical options discussed include traditional open release and endoscopic release. Endoscopic release is preferable for younger patients to minimize scarring and painful scar formation. Risks and benefits of endoscopic release, such as a smaller incision and reduced scarring risk, were discussed. Schedule endoscopic carpal tunnel release based on surgeon's availability.  Dr Edie discussed surgery in detail with patient and will be performing the endoscopic release    H&P reviewed and patient re-examined. No changes.

## 2023-07-22 NOTE — Anesthesia Postprocedure Evaluation (Signed)
 Anesthesia Post Note  Patient: Teresa Strickland  Procedure(s) Performed: RELEASE, CARPAL TUNNEL, ENDOSCOPIC (Left: Wrist)  Patient location during evaluation: PACU Anesthesia Type: General Level of consciousness: awake and alert Pain management: pain level controlled Vital Signs Assessment: post-procedure vital signs reviewed and stable Respiratory status: spontaneous breathing, nonlabored ventilation, respiratory function stable and patient connected to nasal cannula oxygen Cardiovascular status: blood pressure returned to baseline and stable Postop Assessment: no apparent nausea or vomiting Anesthetic complications: no   No notable events documented.   Last Vitals:  Vitals:   07/22/23 1530 07/22/23 1544  BP: 118/72 135/80  Pulse: 70 71  Resp: 15 16  Temp: (!) 36.1 C 36.6 C  SpO2: 100% 100%    Last Pain:  Vitals:   07/22/23 1544  TempSrc: Temporal  PainSc: 0-No pain                 Lynwood KANDICE Clause

## 2023-07-22 NOTE — Discharge Instructions (Addendum)
 Orthopedic discharge instructions: Keep dressing dry and intact. Keep hand elevated above heart level. May shower after dressing removed on postop day 4 (Sunday). Cover sutures with Band-Aids after drying off. Apply ice to affected area frequently. Take ibuprofen 600-800 mg TID with meals for 3-5 days, then as necessary. Take ES Tylenol  or pain medication as prescribed when needed.  Return for follow-up in 10-14 days or as scheduled.

## 2023-07-22 NOTE — Op Note (Signed)
 07/22/2023  2:40 PM  Patient:   Teresa Strickland  Pre-Op Diagnosis:   Left carpal tunnel syndrome.  Post-Op Diagnosis:   Same.  Procedure:   Endoscopic left carpal tunnel release.  Surgeon:   DOROTHA Reyes Maltos, MD  Anesthesia:   General LMA  Findings:   As above.  Complications:   None  EBL:   0 cc  Fluids:   400 cc crystalloid  TT:   10 minutes at 250 mmHg  Drains:   None  Closure:   4-0 Prolene interrupted sutures  Brief Clinical Note:   The patient is a 33 year old female with a history of progressive worsening pain and paresthesias to her left hand. Her symptoms are progressed despite medications, activity modification, etc. Her history and examination are consistent with carpal tunnel syndrome confirmed by ultrasound evaluation of the median nerve at the wrist. The patient presents at this time for an endoscopic left carpal tunnel release.   Procedure:   The patient was brought into the operating room and lain in the supine position. After adequate general laryngeal mask anesthesia was obtained, the left hand and upper extremity were prepped with ChloraPrep solution before being draped sterilely. Preoperative antibiotics were administered. A timeout was performed to verify the appropriate surgical site before the limb was exsanguinated with an Esmarch and the tourniquet inflated to 250 mmHg.   An approximately 1.5-2 cm incision was made over the volar wrist flexion crease, centered over the palmaris longus tendon. The incision was carried down through the subcutaneous tissues with care taken to identify and protect any neurovascular structures. The distal forearm fascia was penetrated just proximal to the transverse carpal ligament. The soft tissues were released off the superficial and deep surfaces of the distal forearm fascia and this was released proximally for 3-4 cm under direct visualization.  Attention was directed distally. The Therapist, nutritional was passed beneath the  transverse carpal ligament along the ulnar aspect of the carpal tunnel and used to release any adhesions as well as to remove any adherent synovial tissue before first the smaller then the larger of the two dilators were passed beneath the transverse carpal ligament along the ulnar margin of the carpal tunnel. The slotted cannula was introduced and the endoscope was placed into the slotted cannula and the undersurface of the transverse carpal ligament visualized. The distal margin of the transverse carpal ligament was marked by placing a 25-gauge needle percutaneously at Kaplan's cardinal point so that it entered the distal portion of the slotted cannula. Under endoscopic visualization, the transverse carpal ligament was released from proximal to distal using the end-cutting blade. A second pass was performed to ensure complete release of the ligament. The adequacy of release was verified both endoscopically and by palpation using the freer elevator.  The wound was irrigated thoroughly with sterile saline solution before being closed using 4-0 Prolene interrupted sutures. A total of 10 cc of 0.5% plain Sensorcaine  was injected in and around the incision before a sterile bulky dressing was applied to the wound. The patient was placed into a volar wrist splint before being awakened, extubated, and returned to the recovery room in satisfactory condition after tolerating the procedure well.

## 2023-07-22 NOTE — Anesthesia Preprocedure Evaluation (Signed)
 Anesthesia Evaluation  Patient identified by MRN, date of birth, ID band Patient awake    Reviewed: Allergy & Precautions, H&P , NPO status , Patient's Chart, lab work & pertinent test results, reviewed documented beta blocker date and time   Airway Mallampati: II  TM Distance: >3 FB Neck ROM: full    Dental  (+) Teeth Intact   Pulmonary neg pulmonary ROS   Pulmonary exam normal        Cardiovascular Exercise Tolerance: Good negative cardio ROS Normal cardiovascular exam Rate:Normal     Neuro/Psych  Neuromuscular disease  negative psych ROS   GI/Hepatic negative GI ROS, Neg liver ROS,,,  Endo/Other  negative endocrine ROS    Renal/GU negative Renal ROS  negative genitourinary   Musculoskeletal   Abdominal   Peds  Hematology negative hematology ROS (+)   Anesthesia Other Findings   Reproductive/Obstetrics negative OB ROS                              Anesthesia Physical Anesthesia Plan  ASA: 2  Anesthesia Plan: General LMA   Post-op Pain Management:    Induction:   PONV Risk Score and Plan:   Airway Management Planned:   Additional Equipment:   Intra-op Plan:   Post-operative Plan:   Informed Consent: I have reviewed the patients History and Physical, chart, labs and discussed the procedure including the risks, benefits and alternatives for the proposed anesthesia with the patient or authorized representative who has indicated his/her understanding and acceptance.       Plan Discussed with: CRNA  Anesthesia Plan Comments:         Anesthesia Quick Evaluation

## 2023-07-22 NOTE — Transfer of Care (Signed)
 Immediate Anesthesia Transfer of Care Note  Patient: Teresa Strickland  Procedure(s) Performed: RELEASE, CARPAL TUNNEL, ENDOSCOPIC (Left: Wrist)  Patient Location: PACU  Anesthesia Type:General  Level of Consciousness: awake, sedated, and pateint uncooperative  Airway & Oxygen Therapy: Patient Spontanous Breathing and Patient connected to face mask oxygen  Post-op Assessment: Report given to RN and Post -op Vital signs reviewed and stable  Post vital signs: Reviewed and stable  Last Vitals:  Vitals Value Taken Time  BP 126/69 07/22/23 14:47  Temp 36.5 C 07/22/23 14:47  Pulse 96 07/22/23 14:52  Resp 20 07/22/23 14:52  SpO2 100 % 07/22/23 14:52  Vitals shown include unfiled device data.  Last Pain:  Vitals:   07/22/23 1447  TempSrc:   PainSc: Asleep    Pt presented to PACU extremely anxious and uncooperative - 2 mg versed  given. PT resting comfortably now.      Complications: No notable events documented.

## 2023-07-23 ENCOUNTER — Encounter: Payer: Self-pay | Admitting: Surgery
